# Patient Record
Sex: Female | Born: 1990 | Race: Black or African American | Hispanic: No | Marital: Single | Smoking: Never smoker
Health system: Southern US, Community
[De-identification: ages and names within clinical notes are randomized; demographics above are authoritative.]

## PROBLEM LIST (undated history)

## (undated) ENCOUNTER — Inpatient Hospital Stay (HOSPITAL_COMMUNITY): Payer: Self-pay

## (undated) ENCOUNTER — Emergency Department: Discharge status: Absent without leave | Payer: Self-pay | Source: Home / Self Care

## (undated) ENCOUNTER — Ambulatory Visit (HOSPITAL_COMMUNITY): Admission: EM | Payer: Medicaid Other

## (undated) DIAGNOSIS — J45909 Unspecified asthma, uncomplicated: Secondary | ICD-10-CM

---
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
## 2012-08-15 ENCOUNTER — Emergency Department (HOSPITAL_COMMUNITY)
Admission: EM | Admit: 2012-08-15 | Discharge: 2012-08-15 | Disposition: A | Discharge status: Home Health | Payer: BC Managed Care – PPO | Attending: Emergency Medicine | Admitting: Emergency Medicine

## 2012-08-15 ENCOUNTER — Encounter (HOSPITAL_COMMUNITY): Payer: Self-pay

## 2012-08-15 DIAGNOSIS — J45909 Unspecified asthma, uncomplicated: Secondary | ICD-10-CM | POA: Insufficient documentation

## 2012-08-15 DIAGNOSIS — Z113 Encounter for screening for infections with a predominantly sexual mode of transmission: Secondary | ICD-10-CM | POA: Insufficient documentation

## 2012-08-15 DIAGNOSIS — Z139 Encounter for screening, unspecified: Secondary | ICD-10-CM

## 2012-08-15 HISTORY — DX: Unspecified asthma, uncomplicated: J45.909

## 2012-08-15 MED ORDER — ONDANSETRON 4 MG PO TBDP
8.0000 mg | ORAL_TABLET | Freq: Once | ORAL | Status: AC
  Administered 2012-08-15: 8 mg via ORAL
  Filled 2012-08-15: qty 1

## 2012-08-15 MED ORDER — CEFTRIAXONE SODIUM 250 MG IJ SOLR
250.0000 mg | Freq: Once | INTRAMUSCULAR | Status: AC
  Administered 2012-08-15: 250 mg via INTRAMUSCULAR
  Filled 2012-08-15: qty 250

## 2012-08-15 MED ORDER — LIDOCAINE HCL (PF) 1 % IJ SOLN
INTRAMUSCULAR | Status: AC
  Administered 2012-08-15: 11:00:00
  Filled 2012-08-15: qty 5

## 2012-08-15 MED ORDER — AZITHROMYCIN 250 MG PO TABS
1000.0000 mg | ORAL_TABLET | Freq: Once | ORAL | Status: AC
  Administered 2012-08-15: 1000 mg via ORAL
  Filled 2012-08-15: qty 4

## 2012-08-15 NOTE — ED Notes (Signed)
Pt sexual partner seen here yesterday and dx with Chlamydia.  Pt tested [positive yesterday for same at health department. Was told by health department that they only given treatment on Tuesdays and Thursday so pt is here for med

## 2012-08-15 NOTE — ED Provider Notes (Signed)
History     CSN: [id number]  Arrival date & time 08/15/12  1018   First MD Initiated Contact with Patient 08/15/12 1025      Chief Complaint  Patient presents with  . SEXUALLY TRANSMITTED DISEASE     HPI Pt was seen at 1040.  Per pt, c/o unknown onset and persistence of constant STD exposure that began several days ago.  Pt states her sexual partner was dx with chlamydia yesterday.  States she was eval at [facility] for same, dx with chlamydia, but "they can't treat me until Tuesday so I came here to get meds."  Denies dysuria, no vaginal bleeding/discharge, no abd/pelvic pain, no back pain, no N/V/D, no fevers.     Past Medical History  Diagnosis Date  . Asthma     No past surgical history on file.   History  Substance Use Topics  . Smoking status: Never Smoker   . Smokeless tobacco: Not on file  . Alcohol Use: No      Review of Systems ROS: Statement: All systems negative except as marked or noted in the HPI; Constitutional: Negative for fever and chills. ; ; Eyes: Negative for eye pain, redness and discharge. ; ; ENMT: Negative for ear pain, hoarseness, nasal congestion, sinus pressure and sore throat. ; ; Cardiovascular: Negative for chest pain, palpitations, diaphoresis, dyspnea and peripheral edema. ; ; Respiratory: Negative for cough, wheezing and stridor. ; ; Gastrointestinal: Negative for nausea, vomiting, diarrhea, abdominal pain, blood in stool, hematemesis, jaundice and rectal bleeding. . ; ; Genitourinary: Negative for dysuria, flank pain and hematuria. ; ; Musculoskeletal: Negative for back pain and neck pain. Negative for swelling and trauma.; ; GYN:  No vaginal bleeding, no vaginal discharge, no vulvar pain. ;; Skin: Negative for pruritus, rash, abrasions, blisters, bruising and skin lesion.; ; Neuro: Negative for headache, lightheadedness and neck stiffness. Negative for weakness, altered level of consciousness , altered mental status, extremity weakness, paresthesias,  involuntary movement, seizure and syncope.     Allergies  Review of patient's allergies indicates no known allergies.  Home Medications  No current outpatient prescriptions on file.  BP 110/80  Pulse 75  Temp 98 F (36.7 C) (Oral)  Resp 20  SpO2 100%  LMP 08/01/2012  Physical Exam 1045: Physical examination:  Nursing notes reviewed; Vital signs and O2 SAT reviewed;  Constitutional: Well developed, Well nourished, Well hydrated, In no acute distress; Head:  Normocephalic, atraumatic; Eyes: EOMI, PERRL, No scleral icterus; ENMT: Mouth and pharynx normal, Mucous membranes moist; Neck: Supple, Full range of motion, No lymphadenopathy; Cardiovascular: Regular rate and rhythm, No murmur, rub, or gallop; Respiratory: Breath sounds clear & equal bilaterally, No rales, rhonchi, wheezes.  Speaking full sentences with ease, Normal respiratory effort/excursion; Chest: Nontender, Movement normal; Abdomen: Soft, Nontender, Nondistended, Normal bowel sounds; Genitourinary: No CVA tenderness; Extremities: Pulses normal, No tenderness, No edema, No calf edema or asymmetry.; Neuro: AA&Ox3, Major CN grossly intact.  Speech clear. No gross focal motor or sensory deficits in extremities.; Skin: Color normal, Warm, Dry.   ED Course  Procedures    MDM  MDM Reviewed: nursing note and vitals      1050:  Will tx for GC and chlamydia here in the ED.  Pt given resources to obtain OB/GYN for good continuity of care and routine women's care.  Verb understaniding.       Laray Anger, DO 08/17/12 1245

## 2013-04-29 ENCOUNTER — Inpatient Hospital Stay (HOSPITAL_COMMUNITY)
Admission: AD | Admit: 2013-04-29 | Discharge: 2013-04-30 | Disposition: A | Discharge status: Home / Self Care | Payer: Self-pay | Source: Ambulatory Visit | Attending: Obstetrics & Gynecology | Admitting: Obstetrics & Gynecology

## 2013-04-29 ENCOUNTER — Encounter (HOSPITAL_COMMUNITY): Payer: Self-pay | Admitting: Emergency Medicine

## 2013-04-29 ENCOUNTER — Emergency Department (HOSPITAL_COMMUNITY)
Admission: EM | Admit: 2013-04-29 | Discharge: 2013-04-29 | Discharge status: Against Medical Advice | Payer: Self-pay | Attending: Emergency Medicine | Admitting: Emergency Medicine

## 2013-04-29 ENCOUNTER — Inpatient Hospital Stay (HOSPITAL_COMMUNITY): Payer: Self-pay

## 2013-04-29 ENCOUNTER — Encounter (HOSPITAL_COMMUNITY): Payer: Self-pay | Admitting: Advanced Practice Midwife

## 2013-04-29 DIAGNOSIS — A499 Bacterial infection, unspecified: Secondary | ICD-10-CM | POA: Insufficient documentation

## 2013-04-29 DIAGNOSIS — O9989 Other specified diseases and conditions complicating pregnancy, childbirth and the puerperium: Secondary | ICD-10-CM | POA: Insufficient documentation

## 2013-04-29 DIAGNOSIS — O209 Hemorrhage in early pregnancy, unspecified: Secondary | ICD-10-CM | POA: Insufficient documentation

## 2013-04-29 DIAGNOSIS — J45909 Unspecified asthma, uncomplicated: Secondary | ICD-10-CM | POA: Insufficient documentation

## 2013-04-29 DIAGNOSIS — O2390 Unspecified genitourinary tract infection in pregnancy, unspecified trimester: Secondary | ICD-10-CM | POA: Insufficient documentation

## 2013-04-29 DIAGNOSIS — M545 Low back pain, unspecified: Secondary | ICD-10-CM | POA: Insufficient documentation

## 2013-04-29 DIAGNOSIS — B9689 Other specified bacterial agents as the cause of diseases classified elsewhere: Secondary | ICD-10-CM | POA: Insufficient documentation

## 2013-04-29 DIAGNOSIS — O200 Threatened abortion: Secondary | ICD-10-CM | POA: Insufficient documentation

## 2013-04-29 DIAGNOSIS — R109 Unspecified abdominal pain: Secondary | ICD-10-CM | POA: Insufficient documentation

## 2013-04-29 DIAGNOSIS — N760 Acute vaginitis: Secondary | ICD-10-CM | POA: Insufficient documentation

## 2013-04-29 LAB — URINALYSIS, ROUTINE W REFLEX MICROSCOPIC
Bilirubin Urine: NEGATIVE
Glucose, UA: NEGATIVE mg/dL
Hgb urine dipstick: NEGATIVE
Ketones, ur: NEGATIVE mg/dL
Leukocytes, UA: NEGATIVE
Nitrite: NEGATIVE
Protein, ur: NEGATIVE mg/dL
Specific Gravity, Urine: 1.025 (ref 1.005–1.030)
Urobilinogen, UA: 1 mg/dL (ref 0.0–1.0)
pH: 8 (ref 5.0–8.0)

## 2013-04-29 LAB — COMPREHENSIVE METABOLIC PANEL
ALT: 13 U/L (ref 0–35)
AST: 15 U/L (ref 0–37)
Albumin: 4 g/dL (ref 3.5–5.2)
Alkaline Phosphatase: 45 U/L (ref 39–117)
BUN: 15 mg/dL (ref 6–23)
CO2: 25 mEq/L (ref 19–32)
Calcium: 9.3 mg/dL (ref 8.4–10.5)
Chloride: 100 mEq/L (ref 96–112)
Creatinine, Ser: 0.68 mg/dL (ref 0.50–1.10)
GFR calc Af Amer: >90 mL/min (ref >90)
GFR calc non Af Amer: >90 mL/min (ref >90)
Glucose, Bld: 88 mg/dL (ref 70–99)
Potassium: 3.5 mEq/L (ref 3.5–5.1)
Sodium: 136 mEq/L (ref 135–145)
Total Bilirubin: 1.5 mg/dL — ABNORMAL HIGH (ref 0.3–1.2)
Total Protein: 7 g/dL (ref 6.0–8.3)

## 2013-04-29 LAB — CBC WITH DIFFERENTIAL/PLATELET
Basophils Absolute: 0 10*3/uL (ref 0.0–0.1)
Basophils Relative: 0 % (ref 0–1)
Eosinophils Absolute: 0.3 10*3/uL (ref 0.0–0.7)
Eosinophils Relative: 2 % (ref 0–5)
HCT: 35.9 % — ABNORMAL LOW (ref 36.0–46.0)
Hemoglobin: 12.2 g/dL (ref 12.0–15.0)
Lymphocytes Relative: 24 % (ref 12–46)
Lymphs Abs: 2.9 10*3/uL (ref 0.7–4.0)
MCH: 27.3 pg (ref 26.0–34.0)
MCHC: 34 g/dL (ref 30.0–36.0)
MCV: 80.3 fL (ref 78.0–100.0)
Monocytes Absolute: 0.6 10*3/uL (ref 0.1–1.0)
Monocytes Relative: 5 % (ref 3–12)
Neutro Abs: 8.4 10*3/uL — ABNORMAL HIGH (ref 1.7–7.7)
Neutrophils Relative %: 69 % (ref 43–77)
Platelets: 260 10*3/uL (ref 150–400)
RBC: 4.47 MIL/uL (ref 3.87–5.11)
RDW: 12.4 % (ref 11.5–15.5)
WBC: 12.1 10*3/uL — ABNORMAL HIGH (ref 4.0–10.5)

## 2013-04-29 LAB — POCT PREGNANCY, URINE: Preg Test, Ur: POSITIVE — AB

## 2013-04-29 LAB — HCG, QUANTITATIVE, PREGNANCY: hCG, Beta Chain, Quant, S: 6732 m[IU]/mL — ABNORMAL HIGH (ref <5)

## 2013-04-29 NOTE — ED Notes (Signed)
Pt asked nurse 1st if it would "mess anything up" if she went to [facility] instead.  Encouraged pt to stay and explained to her that the wait was not much longer and there are multiple discharges. Pt told visitor and he is [facility]  Pt states again that she is leaving.  Informed pt that test results were back and that it would not be long before she saw MD.  Pt told visitor again and he told her to go ahead and leave.  Debby Bud, pt advocate also encouraged pt and visitor to stay and they left.

## 2013-04-29 NOTE — ED Notes (Signed)
PT. REPORTS VAGINAL SPOTTING FOR SEVERAL DAYS WITH LOW ABDOMINAL CRAMPING / LOW BACK PAIN FOR 2 WEEKS , PT. STATED POSITIVE HOME PREG TEST , UNSURE OF LMP AND AOG.

## 2013-04-29 NOTE — MAU Note (Signed)
Patient states she was seen at [facility] and left AMA due to the wait. States she has had no bleeding since 1800 today. Denies bright red bleeding. States it was brown "spotting" and a little red after. Here to be seen for same symptoms.

## 2013-04-29 NOTE — MAU Note (Signed)
Pt reports pain in lower abd, sharp pain in lower back, and spotting today.  Positive home preg test , LMP 03/22/2013

## 2013-04-29 NOTE — ED Notes (Signed)
Unable to discharge pt from [facility] due to her being in computer at [facility].  Notified MAU and they are attempted to fix.

## 2013-04-30 ENCOUNTER — Encounter (HOSPITAL_COMMUNITY): Payer: Self-pay

## 2013-04-30 DIAGNOSIS — O200 Threatened abortion: Secondary | ICD-10-CM

## 2013-04-30 LAB — WET PREP, GENITAL
Trich, Wet Prep: NONE SEEN
Yeast Wet Prep HPF POC: NONE SEEN

## 2013-04-30 LAB — ABO/RH: ABO/RH(D): A POS

## 2013-04-30 LAB — GC/CHLAMYDIA PROBE AMP
CT Probe RNA: NEGATIVE
GC Probe RNA: NEGATIVE

## 2013-04-30 MED ORDER — CONCEPT OB 130-92.4-1 MG PO CAPS: 1.0000 | ORAL_CAPSULE | Freq: Every day | ORAL | Status: DC

## 2013-04-30 MED ORDER — METRONIDAZOLE 500 MG PO TABS: 500.0000 mg | ORAL_TABLET | Freq: Two times a day (BID) | ORAL | Status: DC

## 2013-04-30 NOTE — MAU Provider Note (Signed)
Chief Complaint: Abdominal Pain, Vaginal Bleeding and Possible Pregnancy   First Provider Initiated Contact with Patient 04/29/13 2315     SUBJECTIVE HPI: Taylor Strickland is a 22 y.o. G1P0 at [redacted]w[redacted]d by LMP who presents with pos UPT and spotting cramping x a few days. Denies fever, chills, urinary complaints, GI complaints, vaginal discharge, passage of clots or tissue. Was seen at [facility] ED prior to MAU visit but left AMA due to way time. Labs were drawn the pelvic exam ultrasound has not been done.  Past Medical History  Diagnosis Date  . Asthma    OB History   Grav Para Term Preterm Abortions TAB SAB Ect Mult Living   1              # Outc Date GA Lbr Len/2nd Wgt Sex Del Anes PTL Lv   1 CUR              History reviewed. No pertinent past surgical history. History   Social History  . Marital Status: Single    Spouse Name: N/A    Number of Children: N/A  . Years of Education: N/A   Occupational History  . Not on file.   Social History Main Topics  . Smoking status: Never Smoker   . Smokeless tobacco: Not on file  . Alcohol Use: No  . Drug Use: No  . Sexually Active: Yes   Other Topics Concern  . Not on file   Social History Narrative  . No narrative on file   No current facility-administered medications on file prior to encounter.   No current outpatient prescriptions on file prior to encounter.   No Known Allergies  ROS: Pertinent items in HPI  OBJECTIVE Blood pressure 113/76, pulse 75, temperature 98.6 F (37 C), temperature source Oral, resp. rate 18, height 5\' 2"  (1.575 m), weight 63.05 kg (139 lb), last menstrual period 03/22/2013, SpO2 100.00%. GENERAL: Well-developed, well-nourished female in no acute distress.  HEENT: Normocephalic HEART: normal rate RESP: normal effort ABDOMEN: Soft, non-tender EXTREMITIES: Nontender, no edema NEURO: Alert and oriented SPECULUM EXAM: NEFG, physiologic discharge, no blood noted, cervix clean, anterior  cervix BIMANUAL: cervix closed; uterus slightly enlarged, retroverted, no adnexal tenderness or masses. No CMT.  LAB RESULTS Results for orders placed during the hospital encounter of 04/29/13 (from the past 24 hour(s))  WET PREP, GENITAL     Status: Abnormal   Collection Time    04/29/13 11:55 PM      Result Value Range   Yeast Wet Prep HPF POC NONE SEEN  NONE SEEN   Trich, Wet Prep NONE SEEN  NONE SEEN   Clue Cells Wet Prep HPF POC FEW (*) NONE SEEN   WBC, Wet Prep HPF POC FEW (*) NONE SEEN  ABO/RH     Status: None   Collection Time    04/29/13 11:55 PM      Result Value Range   ABO/RH(D) A POS     Results for Taylor Strickland (MRN [id number]) as of 04/30/2013 00:23  Ref. Range 04/29/2013 21:40 04/29/2013 21:41 04/29/2013 21:47  Sodium Latest Range: 135-145 mEq/L  136   Potassium Latest Range: 3.5-5.1 mEq/L  3.5   Chloride Latest Range: 96-112 mEq/L  100   CO2 Latest Range: 19-32 mEq/L  25   BUN Latest Range: 6-23 mg/dL  15   Creatinine Latest Range: 0.50-1.10 mg/dL  4.09   Calcium Latest Range: 8.4-10.5 mg/dL  9.3   GFR calc non Af Amer Latest Range: >  90 mL/min  >90   GFR calc Af Amer Latest Range: >90 mL/min  >90   Glucose Latest Range: 70-99 mg/dL  88   Alkaline Phosphatase Latest Range: 39-117 U/L  45   Albumin Latest Range: 3.5-5.2 g/dL  4.0   AST Latest Range: 0-37 U/L  15   ALT Latest Range: 0-35 U/L  13   Total Protein Latest Range: 6.0-8.3 g/dL  7.0   Total Bilirubin Latest Range: 0.3-1.2 mg/dL  1.5 (H)   hCG, Beta Chain, Quant, S Latest Range: <5 mIU/mL 6732 (H)    WBC Latest Range: 4.0-10.5 K/uL  12.1 (H)   RBC Latest Range: 3.87-5.11 MIL/uL  4.47   Hemoglobin Latest Range: 12.0-15.0 g/dL  78.2   HCT Latest Range: 36.0-46.0 %  35.9 (L)   MCV Latest Range: 78.0-100.0 fL  80.3   MCH Latest Range: 26.0-34.0 pg  27.3   MCHC Latest Range: 30.0-36.0 g/dL  95.6   RDW Latest Range: 11.5-15.5 %  12.4   Platelets Latest Range: 150-400 K/uL  260   Neutrophils Relative %  Latest Range: 43-77 %  69   Lymphocytes Relative Latest Range: 12-46 %  24   Monocytes Relative Latest Range: 3-12 %  5   Eosinophils Relative Latest Range: 0-5 %  2   Basophils Relative Latest Range: 0-1 %  0   NEUT# Latest Range: 1.7-7.7 K/uL  8.4 (H)   Lymphocytes Absolute Latest Range: 0.7-4.0 K/uL  2.9   Monocytes Absolute Latest Range: 0.1-1.0 K/uL  0.6   Eosinophils Absolute Latest Range: 0.0-0.7 K/uL  0.3   Basophils Absolute Latest Range: 0.0-0.1 K/uL  0.0   Preg Test, Ur Latest Range: NEGATIVE    POSITIVE (A)    IMAGING US Ob Comp Less 14 Wks  04/30/2013   *RADIOLOGY REPORT*  Clinical Data: Pelvic cramping and vaginal spotting.  OBSTETRIC <14 WK [place] AND TRANSVAGINAL OB US  Technique:  Both transabdominal and transvaginal ultrasound examinations were performed for complete evaluation of the gestation as well as the maternal uterus, adnexal regions, and pelvic cul-de-sac.  Transvaginal technique was performed to assess early pregnancy.  Comparison:  None.  Intrauterine gestational sac:  Visualized/normal in shape. Yolk sac: Yes Embryo: No Cardiac Activity: N/A  MSD: 9.4 mm  5 w 4 d  Maternal uterus/adnexae: No subchorionic hemorrhage is noted.  The uterus is retroverted and otherwise unremarkable in appearance.  The ovaries are within normal limits.  The right ovary measures 3.9 x 2.2 x 2.8 cm, while the left ovary measures 3.2 x 1.7 x 2.0 cm. No suspicious adnexal masses are seen; there is no evidence for ovarian torsion.  Trace free fluid is noted in the pelvic cul-de-sac, likely physiologic in nature.  IMPRESSION: Single intrauterine gestational sac noted, with a mean sac diameter of 0.9 cm, corresponding to a gestational age of [redacted] weeks 4 days. This matches the gestational age of [redacted] weeks 3 days by LMP, reflecting an estimated date of delivery of December 27, 2013.  The embryo is not yet seen.   Original Report Authenticated By: Tonia Ghent, M.D.   US Ob Transvaginal  04/30/2013    *RADIOLOGY REPORT*  Clinical Data: Pelvic cramping and vaginal spotting.  OBSTETRIC <14 WK [place] AND TRANSVAGINAL OB US  Technique:  Both transabdominal and transvaginal ultrasound examinations were performed for complete evaluation of the gestation as well as the maternal uterus, adnexal regions, and pelvic cul-de-sac.  Transvaginal technique was performed to assess early pregnancy.  Comparison:  None.  Intrauterine gestational sac:  Visualized/normal in shape. Yolk sac: Yes Embryo: No Cardiac Activity: N/A  MSD: 9.4 mm  5 w 4 d  Maternal uterus/adnexae: No subchorionic hemorrhage is noted.  The uterus is retroverted and otherwise unremarkable in appearance.  The ovaries are within normal limits.  The right ovary measures 3.9 x 2.2 x 2.8 cm, while the left ovary measures 3.2 x 1.7 x 2.0 cm. No suspicious adnexal masses are seen; there is no evidence for ovarian torsion.  Trace free fluid is noted in the pelvic cul-de-sac, likely physiologic in nature.  IMPRESSION: Single intrauterine gestational sac noted, with a mean sac diameter of 0.9 cm, corresponding to a gestational age of [redacted] weeks 4 days. This matches the gestational age of [redacted] weeks 3 days by LMP, reflecting an estimated date of delivery of December 27, 2013.  The embryo is not yet seen.   Original Report Authenticated By: Tonia Ghent, M.D.    MAU COURSE No bleeding while in MAU.  ASSESSMENT 1. Threatened miscarriage in early pregnancy. IUP verified.   2. BV (bacterial vaginosis)    PLAN Discharge home in stable condition. Pelvic rest x1 week. GC Chlamydia pending. SAB precautions. Follow-up Information   Follow up with Obstetrician. (Start prenatal care)       Follow up with [facility] [place] [facility]. (As needed if symptoms worsen)    Contact information:   [address] [id number] [place] [address] [phone]       Medication List    TAKE these medications       CONCEPT OB 130-92.4-1  MG Caps  Take 1 tablet by mouth daily.     metroNIDAZOLE 500 MG tablet  Commonly known as:  FLAGYL  Take 1 tablet (500 mg total) by mouth 2 (two) times daily.       [place], [place] 04/30/2013  12:33 AM

## 2013-06-01 ENCOUNTER — Encounter (HOSPITAL_COMMUNITY): Payer: Self-pay | Admitting: Emergency Medicine

## 2013-06-01 ENCOUNTER — Emergency Department (HOSPITAL_COMMUNITY)
Admission: EM | Admit: 2013-06-01 | Discharge: 2013-06-01 | Disposition: A | Discharge status: Home / Self Care | Payer: Medicaid Other | Attending: Emergency Medicine | Admitting: Emergency Medicine

## 2013-06-01 DIAGNOSIS — Z3201 Encounter for pregnancy test, result positive: Secondary | ICD-10-CM | POA: Insufficient documentation

## 2013-06-01 DIAGNOSIS — R079 Chest pain, unspecified: Secondary | ICD-10-CM

## 2013-06-01 DIAGNOSIS — Z3490 Encounter for supervision of normal pregnancy, unspecified, unspecified trimester: Secondary | ICD-10-CM

## 2013-06-01 DIAGNOSIS — R109 Unspecified abdominal pain: Secondary | ICD-10-CM

## 2013-06-01 DIAGNOSIS — O9989 Other specified diseases and conditions complicating pregnancy, childbirth and the puerperium: Secondary | ICD-10-CM | POA: Insufficient documentation

## 2013-06-01 DIAGNOSIS — J45909 Unspecified asthma, uncomplicated: Secondary | ICD-10-CM | POA: Insufficient documentation

## 2013-06-01 LAB — BASIC METABOLIC PANEL
BUN: 12 mg/dL (ref 6–23)
CO2: 24 mEq/L (ref 19–32)
Calcium: 9.4 mg/dL (ref 8.4–10.5)
Chloride: 100 mEq/L (ref 96–112)
Creatinine, Ser: 0.51 mg/dL (ref 0.50–1.10)
GFR calc Af Amer: >90 mL/min (ref >90)
GFR calc non Af Amer: >90 mL/min (ref >90)
Glucose, Bld: 94 mg/dL (ref 70–99)
Potassium: 3.8 mEq/L (ref 3.5–5.1)
Sodium: 134 mEq/L — ABNORMAL LOW (ref 135–145)

## 2013-06-01 LAB — CBC
HCT: 36.9 % (ref 36.0–46.0)
Hemoglobin: 12.6 g/dL (ref 12.0–15.0)
MCH: 27.5 pg (ref 26.0–34.0)
MCHC: 34.1 g/dL (ref 30.0–36.0)
MCV: 80.4 fL (ref 78.0–100.0)
Platelets: 221 10*3/uL (ref 150–400)
RBC: 4.59 MIL/uL (ref 3.87–5.11)
RDW: 12.9 % (ref 11.5–15.5)
WBC: 12.7 10*3/uL — ABNORMAL HIGH (ref 4.0–10.5)

## 2013-06-01 LAB — URINALYSIS W MICROSCOPIC + REFLEX CULTURE
Bilirubin Urine: NEGATIVE
Glucose, UA: NEGATIVE mg/dL
Hgb urine dipstick: NEGATIVE
Ketones, ur: NEGATIVE mg/dL
Leukocytes, UA: NEGATIVE
Nitrite: NEGATIVE
Protein, ur: NEGATIVE mg/dL
Specific Gravity, Urine: 1.022 (ref 1.005–1.030)
Urobilinogen, UA: 0.2 mg/dL (ref 0.0–1.0)
pH: 7 (ref 5.0–8.0)

## 2013-06-01 LAB — POCT I-STAT TROPONIN I: Troponin i, poc: 0 ng/mL (ref 0.00–0.08)

## 2013-06-01 LAB — PREGNANCY, URINE: Preg Test, Ur: POSITIVE — AB

## 2013-06-01 LAB — WET PREP, GENITAL
Trich, Wet Prep: NONE SEEN
WBC, Wet Prep HPF POC: NONE SEEN
Yeast Wet Prep HPF POC: NONE SEEN

## 2013-06-01 NOTE — ED Notes (Signed)
Pt states she has been having intermittent chest pain and back pain for past month.  Pt denies cp at present.  Pt is [redacted] weeks pregnant.  Pt states she gets sob during pain episodes.

## 2013-06-01 NOTE — ED Provider Notes (Signed)
History    CSN: [id number] Arrival date & time 06/01/13  1724  First MD Initiated Contact with Patient 06/01/13 1921     Chief Complaint  Patient presents with  . abdominal/chest pain, [redacted] weeks pregnant   . Chest Pain   (Consider location/radiation/quality/duration/timing/severity/associated sxs/prior Treatment) HPI Taylor Strickland is a 22 y.o. female who presents to ED with complaint of intermittent chest pain, abdominal pain for a month. States pains in stomach and abdomen are independent of each other. Come and go lasting from several minute to several hours. Chest pains are sharp. Abdominal pains are crampy. Pt denies fever, chills, cough, shortness of breath, nausea, vomiting, urinary symptoms, vaginal bleeding or discharge. Pt has not taken any  Medications for this. Pt states she is about [redacted]wks pregnant by [place] which was done 4 wks ago.    Past Medical History  Diagnosis Date  . Asthma    History reviewed. No pertinent past surgical history. History reviewed. No pertinent family history. History  Substance Use Topics  . Smoking status: Never Smoker   . Smokeless tobacco: Not on file  . Alcohol Use: No   OB History   Grav Para Term Preterm Abortions TAB SAB Ect Mult Living   1              Review of Systems  Constitutional: Negative for chills and fatigue.  HENT: Negative for neck pain and neck stiffness.   Respiratory: Positive for chest tightness. Negative for shortness of breath.   Cardiovascular: Positive for chest pain. Negative for leg swelling.  Gastrointestinal: Positive for abdominal pain. Negative for nausea, vomiting, diarrhea and constipation.  Genitourinary: Positive for pelvic pain. Negative for dysuria, flank pain, vaginal bleeding and vaginal discharge.  Neurological: Negative for weakness and headaches.    Allergies  Review of patient's allergies indicates no known allergies.  Home Medications   Current Outpatient Rx  Name  Route  Sig  Dispense   Refill  . Prenat w/o A Vit-FeFum-FePo-FA (CONCEPT OB) 130-92.4-1 MG CAPS   Oral   Take 1 tablet by mouth daily.   30 capsule   12    BP 102/63  Pulse 82  Temp(Src) 98.1 F (36.7 C) (Oral)  Resp 16  SpO2 100%  LMP 03/22/2013 Physical Exam  Nursing note and vitals reviewed. Constitutional: She is oriented to person, place, and time. She appears well-developed and well-nourished.  HENT:  Head: Normocephalic.  Eyes: Conjunctivae are normal.  Neck: Neck supple.  Cardiovascular: Normal rate, regular rhythm and normal heart sounds.   Pulmonary/Chest: Effort normal and breath sounds normal. No respiratory distress. She has no wheezes. She has no rales. She exhibits tenderness.  Abdominal: Soft. Bowel sounds are normal. She exhibits no distension. There is no tenderness. There is no rebound and no guarding.  Genitourinary:  Normal external genitalia. Normal vaginal canal. Cervix normal. No CMT. No adnexal tenderenss  Neurological: She is alert and oriented to person, place, and time.  Skin: Skin is warm and dry.    ED Course  Procedures (including critical care time) Results for orders placed during the hospital encounter of 06/01/13  WET PREP, GENITAL      Result Value Range   Yeast Wet Prep HPF POC NONE SEEN  NONE SEEN   Trich, Wet Prep NONE SEEN  NONE SEEN   Clue Cells Wet Prep HPF POC FEW (*) NONE SEEN   WBC, Wet Prep HPF POC NONE SEEN  NONE SEEN  CBC  Result Value Range   WBC 12.7 (*) 4.0 - 10.5 K/uL   RBC 4.59  3.87 - 5.11 MIL/uL   Hemoglobin 12.6  12.0 - 15.0 g/dL   HCT 96.2  95.2 - 84.1 %   MCV 80.4  78.0 - 100.0 fL   MCH 27.5  26.0 - 34.0 pg   MCHC 34.1  30.0 - 36.0 g/dL   RDW 32.4  40.1 - 02.7 %   Platelets 221  150 - 400 K/uL  BASIC METABOLIC PANEL      Result Value Range   Sodium 134 (*) 135 - 145 mEq/L   Potassium 3.8  3.5 - 5.1 mEq/L   Chloride 100  96 - 112 mEq/L   CO2 24  19 - 32 mEq/L   Glucose, Bld 94  70 - 99 mg/dL   BUN 12  6 - 23 mg/dL    Creatinine, Ser 2.53  0.50 - 1.10 mg/dL   Calcium 9.4  8.4 - 66.4 mg/dL   GFR calc non Af Amer >90  >90 mL/min   GFR calc Af Amer >90  >90 mL/min  URINALYSIS W MICROSCOPIC + REFLEX CULTURE      Result Value Range   Color, Urine YELLOW  YELLOW   APPearance CLEAR  CLEAR   Specific Gravity, Urine 1.022  1.005 - 1.030   pH 7.0  5.0 - 8.0   Glucose, UA NEGATIVE  NEGATIVE mg/dL   Hgb urine dipstick NEGATIVE  NEGATIVE   Bilirubin Urine NEGATIVE  NEGATIVE   Ketones, ur NEGATIVE  NEGATIVE mg/dL   Protein, ur NEGATIVE  NEGATIVE mg/dL   Urobilinogen, UA 0.2  0.0 - 1.0 mg/dL   Nitrite NEGATIVE  NEGATIVE   Leukocytes, UA NEGATIVE  NEGATIVE  PREGNANCY, URINE      Result Value Range   Preg Test, Ur POSITIVE (*) NEGATIVE  POCT I-STAT TROPONIN I      Result Value Range   Troponin i, poc 0.00  0.00 - 0.08 ng/mL   Comment 3             Date: 06/01/2013  Rate: 80  Rhythm: normal sinus rhythm  QRS Axis: normal  Intervals: normal  ST/T Wave abnormalities: normal  Conduction Disutrbances:none  Narrative Interpretation:   Old EKG Reviewed: none available   No results found.   No results found. 1. Pregnancy   2. Chest pain   3. Abdominal  pain, other specified site     MDM  Pt with intermittent chest pains, intermittent abdominal pains. No current symptoms. Exam unremarkable. Pt's VS are normal. Doubt PE given no SOB, no tachycardia, no tachypnea, and she has no present pain, doubt PE. Her pelvic exam unremarkable. She has already had an [place] this pregnancy with a confirmed IUP. She is having no vagina bleeding. At this time no emergent conditions identified and pt is stable for discharge with outpatient follow  Filed Vitals:   06/01/13 1737 06/01/13 2129  BP: 102/63 108/73  Pulse: 82   Temp: 98.1 F (36.7 C)   TempSrc: Oral   Resp: 16 14  SpO2: 100%      Roni Friberg A Shiah Berhow, PA-C 06/01/13 2345

## 2013-06-02 LAB — GC/CHLAMYDIA PROBE AMP
CT Probe RNA: NEGATIVE
GC Probe RNA: NEGATIVE

## 2013-06-02 NOTE — ED Provider Notes (Signed)
Medical screening examination/treatment/procedure(s) were performed by non-physician practitioner and as supervising physician I was immediately available for consultation/collaboration.  Jacia Sickman, MD 06/02/13 0120 

## 2013-12-03 HISTORY — PX: WISDOM TOOTH EXTRACTION: SHX21

## 2013-12-14 ENCOUNTER — Encounter (HOSPITAL_COMMUNITY): Payer: Self-pay | Admitting: Emergency Medicine

## 2013-12-14 ENCOUNTER — Emergency Department (HOSPITAL_COMMUNITY)
Admission: EM | Admit: 2013-12-14 | Discharge: 2013-12-14 | Disposition: A | Discharge status: Home / Self Care | Payer: Medicaid Other | Source: Home / Self Care | Attending: Emergency Medicine | Admitting: Emergency Medicine

## 2013-12-14 DIAGNOSIS — J019 Acute sinusitis, unspecified: Secondary | ICD-10-CM

## 2013-12-14 LAB — POCT RAPID STREP A: Streptococcus, Group A Screen (Direct): NEGATIVE

## 2013-12-14 MED ORDER — AMOXICILLIN-POT CLAVULANATE 875-125 MG PO TABS: 1.0000 | ORAL_TABLET | Freq: Two times a day (BID) | ORAL | Status: DC

## 2013-12-14 MED ORDER — FLUTICASONE PROPIONATE 50 MCG/ACT NA SUSP
2.0000 | Freq: Every day | NASAL | Status: DC
Start: 2013-12-14 — End: 2015-11-27

## 2013-12-14 MED ORDER — TRAMADOL HCL 50 MG PO TABS: ORAL_TABLET | ORAL | Status: DC

## 2013-12-14 NOTE — ED Notes (Signed)
Pt  Reports  Symptoms  Of  sorethroat  X  10 days   With a  Non  Productive  Cough  With  Side  Pain  X  10   Days              She also  Reports  Some  Mild  Malaise  As  Well

## 2013-12-14 NOTE — ED Provider Notes (Signed)
Chief Complaint   Chief Complaint  Patient presents with  . Sore Throat    History of Present Illness   Jerene Cannyshley Leisey is a 23 year old female who's had a two-week history of nasal congestion with yellowish, bloody drainage, sinus pressure, headache, watery eyes, dry cough, wheezing, and chest tightness. The sides of the rib cage ache when she coughs. She's had a sore throat, felt weak, tired, dizzy, chills, and nausea. She denies any specific sick exposures.  Review of Systems   Other than as noted above, the patient denies any of the following symptoms: Systemic:  No fevers, chills, sweats, or myalgias. Eye:  No redness or discharge. ENT:  No ear pain, headache, nasal congestion, drainage, sinus pressure, or sore throat. Neck:  No neck pain, stiffness, or swollen glands. Lungs:  No cough, sputum production, hemoptysis, wheezing, chest tightness, shortness of breath or chest pain. GI:  No abdominal pain, nausea, vomiting or diarrhea.  PMFSH   Past medical history, family history, social history, meds, and allergies were reviewed. She has a history of asthma in the remote past but none recently.  Physical exam   Vital signs:  BP 111/78  Pulse 74  Temp(Src) 98.5 F (36.9 C) (Oral)  Resp 14  SpO2 100%  LMP 12/03/2013  Breastfeeding? Unknown General:  Alert and oriented.  In no distress.  Skin warm and dry. Eye:  No conjunctival injection or drainage. Lids were normal. ENT:  TMs and canals were normal, without erythema or inflammation.  Nasal mucosa was clear and uncongested, copious yellow drainage.  Mucous membranes were moist.  Pharynx was erythematous with no exudate or drainage.  There were no oral ulcerations or lesions. Neck:  Supple, no adenopathy, tenderness or mass. Lungs:  No respiratory distress.  Lungs were clear to auscultation, without wheezes, rales or rhonchi.  Breath sounds were clear and equal bilaterally.  Heart:  Regular rhythm, without gallops, murmers or  rubs. Skin:  Clear, warm, and dry, without rash or lesions.  Labs   Results for orders placed during the hospital encounter of 12/14/13  POCT RAPID STREP A ([facility] URG CARE ONLY)      Result Value Range   Streptococcus, Group A Screen (Direct) NEGATIVE  NEGATIVE    Assessment     The encounter diagnosis was Acute sinusitis.  Plan    1.  Meds:  The following meds were prescribed:   Discharge Medication List as of 12/14/2013  4:50 PM    START taking these medications   Details  amoxicillin-clavulanate (AUGMENTIN) 875-125 MG per tablet Take 1 tablet by mouth 2 (two) times daily., Starting 12/14/2013, Until Discontinued, Normal    fluticasone (FLONASE) 50 MCG/ACT nasal spray Place 2 sprays into both nostrils daily., Starting 12/14/2013, Until Discontinued, Normal    traMADol (ULTRAM) 50 MG tablet 1 to 2 every 8 hours as needed for cough., Normal        2.  Patient Education/Counseling:  The patient was given appropriate handouts, self care instructions, and instructed in symptomatic relief.  Instructed to get extra fluids, rest, and use a cool mist vaporizer.   3.  Follow up:  The patient was told to follow up here if no better in 3 to 4 days, or sooner if becoming worse in any way, and given some red flag symptoms such as increasing fever, difficulty breathing, chest pain, or persistent vomiting which would prompt immediate return.  Follow up here as needed.      Reuben Likesavid C Tremon Sainvil, MD  12/14/13 2226 

## 2013-12-14 NOTE — Discharge Instructions (Signed)

## 2014-02-21 ENCOUNTER — Emergency Department (HOSPITAL_COMMUNITY)
Admission: EM | Admit: 2014-02-21 | Discharge: 2014-02-21 | Disposition: A | Discharge status: Home / Self Care | Payer: Medicaid Other | Source: Home / Self Care | Attending: Emergency Medicine | Admitting: Emergency Medicine

## 2014-02-21 ENCOUNTER — Encounter (HOSPITAL_COMMUNITY): Payer: Self-pay | Admitting: Emergency Medicine

## 2014-02-21 DIAGNOSIS — S058X9A Other injuries of unspecified eye and orbit, initial encounter: Secondary | ICD-10-CM

## 2014-02-21 DIAGNOSIS — S0590XA Unspecified injury of unspecified eye and orbit, initial encounter: Secondary | ICD-10-CM

## 2014-02-21 MED ORDER — PREDNISOLONE ACETATE 1 % OP SUSP: 1.0000 [drp] | Freq: Four times a day (QID) | OPHTHALMIC | Status: DC

## 2014-02-21 MED ORDER — TETRACAINE HCL 0.5 % OP SOLN
OPHTHALMIC | Status: AC
  Filled 2014-02-21: qty 2

## 2014-02-21 NOTE — ED Notes (Signed)
CMA forgot to take pt. out after d/c. Dr. Lorenz CoasterKeller [facility] He said pt. left about 2030.

## 2014-02-21 NOTE — ED Notes (Signed)
Patients left eye is red and sensitive to light, sx started Thursday 02/18/2014. Denies any pain or drainage.

## 2014-02-21 NOTE — Discharge Instructions (Signed)
Apply warm compresses to eye, avoid bright lights, wear sunglasses, do not rub eye.  Follow up with Dr. Gwen PoundsKowalski in morning.
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
## 2014-02-22 MED ORDER — TETANUS-DIPHTH-ACELL PERTUSSIS 5-2.5-18.5 LF-MCG/0.5 IM SUSP
INTRAMUSCULAR | Status: AC
  Filled 2014-02-22: qty 0.5

## 2014-10-04 ENCOUNTER — Encounter (HOSPITAL_COMMUNITY): Payer: Self-pay | Admitting: Emergency Medicine

## 2014-12-03 NOTE — L&D Delivery Note (Signed)
Delivery Note At 11:14 PM, on November 25, 2015, a viable female "Taylor Strickland" was delivered via Vaginal, Spontaneous Delivery (Presentation: Right Occiput Anterior with restitution to ROT).  After delivery of head, nuchal cord noted that shoulders and body was delivered through via somersault maneuver. Infant with flaccid tone and minimal grimace. Tactile stimulation and bulb suction given by provider and infant placed on mother's abdomen where nurse continued tactile stimulation. Infant APGAR: 6, 9. Cord clamped, cut, and blood collected. Placenta delivered spontaneously and noted to be intact with 3VC upon inspection. Patient expresses desire to take placenta home.  Vaginal inspection revealed a vaginal laceration that extended into the left labial.  Lacerations were repaired with 3-0 vicryl on CT-1 and SH needle, respectively. Patient given ~7320mL of 1% lidocaine locally.  Patient tolerated the procedure well. Fundus firm, at 2FB below the umbilicus, but was boggy and bleeding moderate.  Patient given 5mL of IV nubain and uterine exploration yielded ~  75mL of clots.  Bleeding small and patient given 1000 mcg of rectal cytotec.  Mother hemodynamically stable and infant skin to skin, with father, prior to provider exit.  Mother unsure of birth control method and opts to breastfeed.  Infant weight at one hour of life: 7lbs 9oz, 20in.   Anesthesia: None  Episiotomy: None Lacerations: Vaginal;Labial Suture Repair: 3.0 vicryl Est. Blood Loss (mL): 275    Mom to postpartum.  Baby to Couplet care / Skin to Skin.  Cherre RobinsJessica L Aydeen Blume MSN, CNM 11/26/2015, 12:42 AM

## 2015-03-30 ENCOUNTER — Inpatient Hospital Stay (HOSPITAL_COMMUNITY)
Admission: AD | Admit: 2015-03-30 | Discharge: 2015-03-30 | Disposition: A | Discharge status: Home / Self Care | Payer: Medicaid Other | Source: Ambulatory Visit | Attending: Obstetrics and Gynecology | Admitting: Obstetrics and Gynecology

## 2015-03-30 ENCOUNTER — Encounter (HOSPITAL_COMMUNITY): Payer: Self-pay | Admitting: *Deleted

## 2015-03-30 ENCOUNTER — Inpatient Hospital Stay (HOSPITAL_COMMUNITY): Payer: Medicaid Other

## 2015-03-30 DIAGNOSIS — R109 Unspecified abdominal pain: Secondary | ICD-10-CM | POA: Diagnosis not present

## 2015-03-30 DIAGNOSIS — O26899 Other specified pregnancy related conditions, unspecified trimester: Secondary | ICD-10-CM

## 2015-03-30 DIAGNOSIS — O9989 Other specified diseases and conditions complicating pregnancy, childbirth and the puerperium: Secondary | ICD-10-CM | POA: Insufficient documentation

## 2015-03-30 DIAGNOSIS — Z3A01 Less than 8 weeks gestation of pregnancy: Secondary | ICD-10-CM | POA: Diagnosis not present

## 2015-03-30 LAB — CBC
HCT: 38.2 % (ref 36.0–46.0)
HEMOGLOBIN: 12.7 g/dL (ref 12.0–15.0)
MCH: 26.5 pg (ref 26.0–34.0)
MCHC: 33.2 g/dL (ref 30.0–36.0)
MCV: 79.7 fL (ref 78.0–100.0)
Platelets: 269 10*3/uL (ref 150–400)
RBC: 4.79 MIL/uL (ref 3.87–5.11)
RDW: 13.9 % (ref 11.5–15.5)
WBC: 12.7 10*3/uL — ABNORMAL HIGH (ref 4.0–10.5)

## 2015-03-30 LAB — WET PREP, GENITAL
Clue Cells Wet Prep HPF POC: NONE SEEN
Trich, Wet Prep: NONE SEEN
WBC, Wet Prep HPF POC: NONE SEEN
YEAST WET PREP: NONE SEEN

## 2015-03-30 LAB — URINALYSIS, ROUTINE W REFLEX MICROSCOPIC
BILIRUBIN URINE: NEGATIVE
Glucose, UA: NEGATIVE mg/dL
Hgb urine dipstick: NEGATIVE
Ketones, ur: NEGATIVE mg/dL
LEUKOCYTES UA: NEGATIVE
Nitrite: NEGATIVE
PH: 6.5 (ref 5.0–8.0)
Protein, ur: NEGATIVE mg/dL
SPECIFIC GRAVITY, URINE: 1.025 (ref 1.005–1.030)
Urobilinogen, UA: 0.2 mg/dL (ref 0.0–1.0)

## 2015-03-30 LAB — HCG, QUANTITATIVE, PREGNANCY: HCG, BETA CHAIN, QUANT, S: 6202 m[IU]/mL — AB (ref <5)

## 2015-03-30 LAB — POCT PREGNANCY, URINE: PREG TEST UR: POSITIVE — AB

## 2015-03-30 LAB — OB RESULTS CONSOLE GC/CHLAMYDIA: Gonorrhea: NEGATIVE

## 2015-03-30 MED ORDER — PRENATAL VITAMINS 28-0.8 MG PO TABS: 1.0000 | ORAL_TABLET | Freq: Every day | ORAL | Status: DC

## 2015-03-30 NOTE — Discharge Instructions (Signed)

## 2015-03-30 NOTE — MAU Note (Addendum)
Gets really sharp cramps in lower stomach. No bleeding or discharge.  Denies GU, some nausea.  +HPT 2 days ago.

## 2015-03-30 NOTE — MAU Provider Note (Signed)
History     CSN: [id number]  Arrival date and time: 03/30/15 1321   None     Chief Complaint  Patient presents with  . Abdominal Pain   HPI    [name] is a 24 y.o. female G2P0010 @ [redacted]w[redacted]d who presents with abdominal pain. The pain started 2 weeks ago Currently rates her pain 5/10; the pain is located in both sides of her lower stomach. The pain is described as sharp cramps; the pain comes and goes. She has taken ibuprofen and the last dose was yesterday; some relief.   She denies vaginal bleeding at this time.   OB History    Gravida Para Term Preterm AB TAB SAB Ectopic Multiple Living   Past Medical History  Diagnosis Date  . Asthma     History reviewed. No pertinent past surgical history.  History reviewed. No pertinent family history.  History  Substance Use Topics  . Smoking status: Never Smoker   . Smokeless tobacco: Not on file  . Alcohol Use: No    Allergies: No Known Allergies  Prescriptions prior to admission  Medication Sig Dispense Refill Last Dose  . fexofenadine (ALLEGRA) 60 MG tablet Take 60 mg by mouth as needed for allergies or rhinitis.   Past Month at Unknown time  . ibuprofen (ADVIL,MOTRIN) 200 MG tablet Take 200 mg by mouth every 6 (six) hours as needed for mild pain.   03/29/2015 at Unknown time  . loratadine (CLARITIN) 10 MG tablet Take 10 mg by mouth as needed for allergies.   Past Month at Unknown time  . amoxicillin-clavulanate (AUGMENTIN) 875-125 MG per tablet Take 1 tablet by mouth 2 (two) times daily. (Patient not taking: Reported on 03/30/2015) 20 tablet 0   . fluticasone (FLONASE) 50 MCG/ACT nasal spray Place 2 sprays into both nostrils daily. (Patient not taking: Reported on 03/30/2015) 16 g 0   . prednisoLONE acetate (PRED FORTE) 1 % ophthalmic suspension Place 1 drop into the left eye 4 (four) times daily. (Patient not taking: Reported on 03/30/2015) 5 mL 0   . Prenat w/o A Vit-FeFum-FePo-FA (CONCEPT OB)  130-92.4-1 MG CAPS Take 1 tablet by mouth daily. (Patient not taking: Reported on 03/30/2015) 30 capsule 12 Past Week at Unknown  . traMADol (ULTRAM) 50 MG tablet 1 to 2 every 8 hours as needed for cough. (Patient not taking: Reported on 03/30/2015) 30 tablet 0    Results for orders placed or performed during the hospital encounter of 03/30/15 (from the past 48 hour(s))  Urinalysis, Routine w reflex microscopic     Status: None   Collection Time: 03/30/15  1:34 PM  Result Value Ref Range   Color, Urine YELLOW YELLOW   APPearance CLEAR CLEAR   Specific Gravity, Urine 1.025 1.005 - 1.030   pH 6.5 5.0 - 8.0   Glucose, UA NEGATIVE NEGATIVE mg/dL   Hgb urine dipstick NEGATIVE NEGATIVE   Bilirubin Urine NEGATIVE NEGATIVE   Ketones, ur NEGATIVE NEGATIVE mg/dL   Protein, ur NEGATIVE NEGATIVE mg/dL   Urobilinogen, UA 0.2 0.0 - 1.0 mg/dL   Nitrite NEGATIVE NEGATIVE   Leukocytes, UA NEGATIVE NEGATIVE    Comment: MICROSCOPIC NOT DONE ON URINES WITH NEGATIVE PROTEIN, BLOOD, LEUKOCYTES, NITRITE, OR GLUCOSE <1000 mg/dL.  Pregnancy, urine POC     Status: Abnormal   Collection Time: 03/30/15  1:45 PM  Result Value Ref Range   Preg Test, Ur  POSITIVE (A) NEGATIVE    Comment:        THE SENSITIVITY OF THIS METHODOLOGY IS >24 mIU/mL   Wet prep, genital     Status: None   Collection Time: 03/30/15  2:20 PM  Result Value Ref Range   Yeast Wet Prep HPF POC NONE SEEN NONE SEEN   Trich, Wet Prep NONE SEEN NONE SEEN   Clue Cells Wet Prep HPF POC NONE SEEN NONE SEEN   WBC, Wet Prep HPF POC NONE SEEN NONE SEEN    Comment: MANY BACTERIA SEEN  CBC     Status: Abnormal   Collection Time: 03/30/15  2:35 PM  Result Value Ref Range   WBC 12.7 (H) 4.0 - 10.5 K/uL   RBC 4.79 3.87 - 5.11 MIL/uL   Hemoglobin 12.7 12.0 - 15.0 g/dL   HCT 40.938.2 81.136.0 - 91.446.0 %   MCV 79.7 78.0 - 100.0 fL   MCH 26.5 26.0 - 34.0 pg   MCHC 33.2 30.0 - 36.0 g/dL   RDW 78.213.9 95.611.5 - 21.315.5 %   Platelets 269 150 - 400 K/uL    [place] Ob Comp  Less 14 Wks  03/30/2015   CLINICAL DATA:  Pregnant, sharp lower pelvic cramps  EXAM: OBSTETRIC <14 WK US AND TRANSVAGINAL OB US  TECHNIQUE: Both transabdominal and transvaginal ultrasound examinations were performed for complete evaluation of the gestation as well as the maternal uterus, adnexal regions, and pelvic cul-de-sac. Transvaginal technique was performed to assess early pregnancy.  COMPARISON:  None.  FINDINGS: Intrauterine gestational sac: Visualized/normal in shape.  Yolk sac:  Present  Embryo:  Not visualized  MSD: 7.8  mm   5 w   3  d  Maternal uterus/adnexae: No subchronic hemorrhage.  Right ovary is within normal limits, measuring 2.8 x 1.3 x 2.9 cm.  Left ovary is within normal limits, measuring 2.1 x 1.7 x 2.7 cm.  Small volume pelvic ascites.  IMPRESSION: Single intrauterine gestational sac with yolk sac, measuring 5 weeks 3 days by mean sac diameter. No fetal pole is visualized.  Serial beta HCG is suggested. Consider follow-up pelvic ultrasound in 14 days to confirm viability and for more accurate dating.   Electronically Signed   By: Charline BillsSriyesh  Krishnan M.D.   On: 03/30/2015 15:08   [place] Ob Transvaginal  03/30/2015   CLINICAL DATA:  Pregnant, sharp lower pelvic cramps  EXAM: OBSTETRIC <14 WK US AND TRANSVAGINAL OB US  TECHNIQUE: Both transabdominal and transvaginal ultrasound examinations were performed for complete evaluation of the gestation as well as the maternal uterus, adnexal regions, and pelvic cul-de-sac. Transvaginal technique was performed to assess early pregnancy.  COMPARISON:  None.  FINDINGS: Intrauterine gestational sac: Visualized/normal in shape.  Yolk sac:  Present  Embryo:  Not visualized  MSD: 7.8  mm   5 w   3  d  Maternal uterus/adnexae: No subchronic hemorrhage.  Right ovary is within normal limits, measuring 2.8 x 1.3 x 2.9 cm.  Left ovary is within normal limits, measuring 2.1 x 1.7 x 2.7 cm.  Small volume pelvic ascites.  IMPRESSION: Single intrauterine gestational sac  with yolk sac, measuring 5 weeks 3 days by mean sac diameter. No fetal pole is visualized.  Serial beta HCG is suggested. Consider follow-up pelvic ultrasound in 14 days to confirm viability and for more accurate dating.   Electronically Signed   By: Charline BillsSriyesh  Krishnan M.D.   On: 03/30/2015 15:08    Review of Systems  Constitutional: Negative for  fever and chills.  Gastrointestinal: Positive for nausea and abdominal pain. Negative for vomiting, diarrhea and constipation.  Genitourinary: Negative for dysuria, urgency and frequency.   Physical Exam   Blood pressure 113/65, pulse 87, temperature 98.9 F (37.2 C), temperature source Oral, resp. rate 18, height  (1.6 m), weight 67.132 kg (148 lb), last menstrual period 02/21/2015.  Physical Exam  Constitutional: She is oriented to person, place, and time. She appears well-developed and well-nourished. No distress.  HENT:  Head: Normocephalic.  Eyes: Pupils are equal, round, and reactive to light.  Neck: Neck supple.  Respiratory: Effort normal.  GI: Soft. There is generalized tenderness.  Genitourinary:  Speculum exam: Vagina - Small amount of creamy discharge, no odor Cervix - No contact bleeding Bimanual exam: Cervix closed Uterus non tender, normal size Adnexa non tender, no masses bilaterally GC/Chlam, wet prep done Chaperone present for exam.  Musculoskeletal: Normal range of motion.  Neurological: She is alert and oriented to person, place, and time.  Skin: Skin is warm. She is not diaphoretic.  Psychiatric: Her behavior is normal.    MAU Course  Procedures  MDM   Assessment and Plan   A:  SIUP @ [redacted]w[redacted]d Abdominal pain in pregnancy  P:  Discharge home in stable condition RX: Prenatal vitamins Start prenatal care ASAP Return to MAU if symptoms worsen Stop ibuprofen. Ok to take tylenol as needed, as directed on the bottle     Duane Lope, NP 03/30/2015 3:30 PM

## 2015-03-31 LAB — HIV ANTIBODY (ROUTINE TESTING W REFLEX): HIV SCREEN 4TH GENERATION: NONREACTIVE

## 2015-03-31 LAB — GC/CHLAMYDIA PROBE AMP (~~LOC~~) NOT AT ARMC
Chlamydia: NEGATIVE
Neisseria Gonorrhea: NEGATIVE

## 2015-04-19 LAB — OB RESULTS CONSOLE ABO/RH: RH TYPE: POSITIVE

## 2015-04-19 LAB — OB RESULTS CONSOLE GBS: STREP GROUP B AG: NEGATIVE

## 2015-04-19 LAB — OB RESULTS CONSOLE HEPATITIS B SURFACE ANTIGEN: HEP B S AG: NEGATIVE

## 2015-04-19 LAB — OB RESULTS CONSOLE RUBELLA ANTIBODY, IGM: Rubella: IMMUNE

## 2015-04-19 LAB — OB RESULTS CONSOLE RPR: RPR: NONREACTIVE

## 2015-06-23 ENCOUNTER — Encounter (HOSPITAL_COMMUNITY): Payer: Self-pay | Admitting: *Deleted

## 2015-06-23 ENCOUNTER — Emergency Department (HOSPITAL_COMMUNITY)
Admission: EM | Admit: 2015-06-23 | Discharge: 2015-06-23 | Disposition: A | Discharge status: Home / Self Care | Payer: Medicaid Other | Attending: Emergency Medicine | Admitting: Emergency Medicine

## 2015-06-23 DIAGNOSIS — Z4889 Encounter for other specified surgical aftercare: Secondary | ICD-10-CM | POA: Insufficient documentation

## 2015-06-23 DIAGNOSIS — Z79899 Other long term (current) drug therapy: Secondary | ICD-10-CM | POA: Diagnosis not present

## 2015-06-23 DIAGNOSIS — O99512 Diseases of the respiratory system complicating pregnancy, second trimester: Secondary | ICD-10-CM | POA: Diagnosis not present

## 2015-06-23 DIAGNOSIS — Z3A17 17 weeks gestation of pregnancy: Secondary | ICD-10-CM | POA: Diagnosis not present

## 2015-06-23 DIAGNOSIS — J45909 Unspecified asthma, uncomplicated: Secondary | ICD-10-CM | POA: Diagnosis not present

## 2015-06-23 DIAGNOSIS — Z7951 Long term (current) use of inhaled steroids: Secondary | ICD-10-CM | POA: Insufficient documentation

## 2015-06-23 DIAGNOSIS — O9989 Other specified diseases and conditions complicating pregnancy, childbirth and the puerperium: Secondary | ICD-10-CM | POA: Insufficient documentation

## 2015-06-23 DIAGNOSIS — Z5189 Encounter for other specified aftercare: Secondary | ICD-10-CM

## 2015-06-23 NOTE — ED Notes (Signed)
Pt reports injuring right lateral ankle approx 2 weeks ago and is afraid its infected. Abrasion noted but no redness or swelling. Pt is approx [redacted] weeks pregnant. No ob/gyn complaints.

## 2015-06-23 NOTE — Discharge Instructions (Signed)

## 2015-06-23 NOTE — ED Provider Notes (Signed)
History  This chart was scribed for non-physician practitioner, Teressa Lower, FNP,working with Elwin Mocha, MD, by Karle Plumber, ED Scribe. This patient was seen in room TR06C/TR06C and the patient's care was started at 5:48 PM.  Chief Complaint  Patient presents with  . Wound Check   The history is provided by the patient and medical records. No language interpreter was used.    HPI Comments:  Taylor Strickland is a 24 y.o. pregnant female at [redacted] weeks gestation who presents to the Emergency Department complaining of an abrasion to the lateral right ankle that she got about 2 weeks ago. She reports associated purulent drainage from the area last night. She reports only minor pain at this point. She has been applying peroxide to the wound and not covering it with any bandages. She denies modifying factors. She denies fever, chills, nausea, vomiting, numbness, tingling or weakness of the right foot, ankle or leg.  Past Medical History  Diagnosis Date  . Asthma    History reviewed. No pertinent past surgical history. History reviewed. No pertinent family history. History  Substance Use Topics  . Smoking status: Never Smoker   . Smokeless tobacco: Not on file  . Alcohol Use: No   OB History    Gravida Para Term Preterm AB TAB SAB Ectopic Multiple Living   2    1  1         Review of Systems  Constitutional: Negative for fever and chills.  Gastrointestinal: Negative for nausea and vomiting.  Skin: Positive for wound.  Neurological: Negative for weakness and numbness.  All other systems reviewed and are negative.   Allergies  Review of patient's allergies indicates no known allergies.  Home Medications   Prior to Admission medications   Medication Sig Start Date End Date Taking? Authorizing Provider  amoxicillin-clavulanate (AUGMENTIN) 875-125 MG per tablet Take 1 tablet by mouth 2 (two) times daily. Patient not taking: Reported on 03/30/2015 12/14/13   Reuben Likes, MD   fexofenadine (ALLEGRA) 60 MG tablet Take 60 mg by mouth as needed for allergies or rhinitis.    Historical Provider, MD  fluticasone (FLONASE) 50 MCG/ACT nasal spray Place 2 sprays into both nostrils daily. Patient not taking: Reported on 03/30/2015 12/14/13   Reuben Likes, MD  loratadine (CLARITIN) 10 MG tablet Take 10 mg by mouth as needed for allergies.    Historical Provider, MD  prednisoLONE acetate (PRED FORTE) 1 % ophthalmic suspension Place 1 drop into the left eye 4 (four) times daily. Patient not taking: Reported on 03/30/2015 02/21/14   Reuben Likes, MD  Prenat w/o A Vit-FeFum-FePo-FA (CONCEPT OB) 130-92.4-1 MG CAPS Take 1 tablet by mouth daily. Patient not taking: Reported on 03/30/2015 04/30/13   Dorathy Kinsman, CNM  Prenatal Vit-Fe Fumarate-FA (PRENATAL VITAMINS) 28-0.8 MG TABS Take 1 tablet by mouth daily. 03/30/15   Duane Lope, NP  traMADol (ULTRAM) 50 MG tablet 1 to 2 every 8 hours as needed for cough. Patient not taking: Reported on 03/30/2015 12/14/13   Reuben Likes, MD   Triage Vitals: BP 115/69 mmHg  Pulse 88  Temp(Src) 98.3 F (36.8 C) (Oral)  Resp 20  SpO2 98%  LMP 02/21/2015 (Exact Date) Physical Exam  Constitutional: She is oriented to person, place, and time. She appears well-developed and well-nourished.  HENT:  Head: Normocephalic and atraumatic.  Eyes: EOM are normal.  Neck: Normal range of motion.  Cardiovascular: Normal rate.   Pulmonary/Chest: Effort normal.  Musculoskeletal: Normal range  of motion.  Neurological: She is alert and oriented to person, place, and time.  Skin: Skin is warm and dry.  Abrasion to lateral aspect of right ankle. No drainage, erythema or warmth noted to the area.  Psychiatric: She has a normal mood and affect. Her behavior is normal.  Nursing note and vitals reviewed.   ED Course  Procedures (including critical care time) DIAGNOSTIC STUDIES: Oxygen Saturation is 98% on RA, normal by my interpretation.    COORDINATION OF CARE: 5:52 PM- Wound care instructions given. Will dress wound prior to discharge. Pt verbalizes understanding and agrees to plan.  Medications - No data to display  Labs Review Labs Reviewed - No data to display  Imaging Review No results found.   EKG Interpretation None      MDM   Final diagnoses:  Visit for wound check    No sign of infection. Discussed treatment of wound at home  I personally performed the services described in this documentation, which was scribed in my presence. The recorded information has been reviewed and is accurate.    Teressa Lower, NP 06/23/15 1845  Elwin Mocha, MD 06/23/15 2002

## 2015-10-03 ENCOUNTER — Inpatient Hospital Stay (HOSPITAL_COMMUNITY)
Admission: AD | Admit: 2015-10-03 | Discharge: 2015-10-03 | Disposition: A | Discharge status: Home / Self Care | Payer: Medicaid Other | Source: Ambulatory Visit | Attending: Obstetrics and Gynecology | Admitting: Obstetrics and Gynecology

## 2015-10-03 ENCOUNTER — Encounter (HOSPITAL_COMMUNITY): Payer: Self-pay

## 2015-10-03 DIAGNOSIS — O26893 Other specified pregnancy related conditions, third trimester: Secondary | ICD-10-CM | POA: Insufficient documentation

## 2015-10-03 DIAGNOSIS — J029 Acute pharyngitis, unspecified: Secondary | ICD-10-CM | POA: Diagnosis present

## 2015-10-03 DIAGNOSIS — Z3A32 32 weeks gestation of pregnancy: Secondary | ICD-10-CM | POA: Insufficient documentation

## 2015-10-03 DIAGNOSIS — J020 Streptococcal pharyngitis: Secondary | ICD-10-CM

## 2015-10-03 LAB — DIFFERENTIAL
Basophils Absolute: 0 10*3/uL (ref 0.0–0.1)
Basophils Relative: 0 %
EOS ABS: 0.1 10*3/uL (ref 0.0–0.7)
EOS PCT: 1 %
Lymphocytes Relative: 10 %
Lymphs Abs: 1.4 10*3/uL (ref 0.7–4.0)
MONOS PCT: 7 %
Monocytes Absolute: 1 10*3/uL (ref 0.1–1.0)
NEUTROS PCT: 82 %
Neutro Abs: 11.2 10*3/uL — ABNORMAL HIGH (ref 1.7–7.7)

## 2015-10-03 LAB — CBC
HCT: 36.5 % (ref 36.0–46.0)
HEMOGLOBIN: 12.1 g/dL (ref 12.0–15.0)
MCH: 27.8 pg (ref 26.0–34.0)
MCHC: 33.2 g/dL (ref 30.0–36.0)
MCV: 83.9 fL (ref 78.0–100.0)
Platelets: 160 10*3/uL (ref 150–400)
RBC: 4.35 MIL/uL (ref 3.87–5.11)
RDW: 13.6 % (ref 11.5–15.5)
WBC: 14 10*3/uL — ABNORMAL HIGH (ref 4.0–10.5)

## 2015-10-03 LAB — RAPID STREP SCREEN (MED CTR MEBANE ONLY): Streptococcus, Group A Screen (Direct): NEGATIVE

## 2015-10-03 MED ORDER — CEPHALEXIN 500 MG PO CAPS: 500.0000 mg | ORAL_CAPSULE | Freq: Two times a day (BID) | ORAL | Status: DC

## 2015-10-03 NOTE — MAU Note (Signed)
Pt C/O sore throat for several days, hurts to swallow.  Also some nausea & dizziness.  Pt denies uc's, bleeding or LOF.

## 2015-10-03 NOTE — Discharge Instructions (Signed)

## 2015-10-03 NOTE — MAU Note (Signed)
Pt reports dizziness, nausea, and sore throat starting two days ago and getting progressively worse.

## 2015-10-03 NOTE — MAU Provider Note (Signed)
Taylor Strickland is a 24 y.o. G2P0 at 32.0 weeks.  Pt arrived to MAU unannounced.  When she arrived she called the office and spoke to Ms Taylor Strickland stating that MAU would not see.  I arrived in MAU shortly after and EPIC noted that she had only arrived 3 minutes before I received the phone call.  She told the triage nurse she has a sore throat x 2 days.  She is not c/o any pregnancy issues.  When speaking to the pt she said the intake receptionist told her she could not be seen unless she called first so she called Ms Taylor Strickland from the waiting room.  She also stated that when she called the office she was denied an appointment bc "you'll are baby doctors and besides I thought I would be seen quicker by coming here".    She c/o of a sore throat, hard to swallow, no fever, no cough but productive dark phlegm.    History     There are no active problems to display for this patient.   Chief Complaint  Patient presents with  . Sore Throat  . Dizziness   HPI  OB History    Gravida Para Term Preterm AB TAB SAB Ectopic Multiple Living   2    1  1          Past Medical History  Diagnosis Date  . Asthma     Past Surgical History  Procedure Laterality Date  . Wisdom tooth extraction Bilateral 2015    Family History  Problem Relation Age of Onset  . Heart disease Mother   . Diabetes Maternal Grandmother     Social History  Substance Use Topics  . Smoking status: Never Smoker   . Smokeless tobacco: Never Used  . Alcohol Use: No    Allergies: No Known Allergies  Prescriptions prior to admission  Medication Sig Dispense Refill Last Dose  . Prenatal Vit-Fe Fumarate-FA (PRENATAL VITAMINS) 28-0.8 MG TABS Take 1 tablet by mouth daily. 30 tablet 3 10/02/2015 at Unknown time  . amoxicillin-clavulanate (AUGMENTIN) 875-125 MG per tablet Take 1 tablet by mouth 2 (two) times daily. (Patient not taking: Reported on 10/03/2015) 20 tablet 0 Not Taking at Unknown time  . fluticasone (FLONASE) 50  MCG/ACT nasal spray Place 2 sprays into both nostrils daily. (Patient not taking: Reported on 10/03/2015) 16 g 0 Not Taking at Unknown time  . prednisoLONE acetate (PRED FORTE) 1 % ophthalmic suspension Place 1 drop into the left eye 4 (four) times daily. (Patient not taking: Reported on 10/03/2015) 5 mL 0 Not Taking at Unknown time  . Prenat w/o A Vit-FeFum-FePo-FA (CONCEPT OB) 130-92.4-1 MG CAPS Take 1 tablet by mouth daily. (Patient not taking: Reported on 10/03/2015) 30 capsule 12 Not Taking at Unknown time  . traMADol (ULTRAM) 50 MG tablet 1 to 2 every 8 hours as needed for cough. (Patient not taking: Reported on 10/03/2015) 30 tablet 0     ROS See HPI above, all other systems are negative  Physical Exam   Blood pressure 109/69, pulse 93, temperature 98.1 F (36.7 C), temperature source Oral, resp. rate 18, height 5\' 3"  (1.6 m), weight 184 lb (83.462 kg), last menstrual period 02/21/2015.  Physical Exam Ext:  WNL ABD: Soft, non tender to palpation, no rebound or guarding SVE: deferred   ED Course  Assessment: IUP at  32.0 weeks Membranes: intact FHR: Category 1 CTX:  none  Plan: Labs: strept culture, CBC Consulted Dr. Stefano Strickland.  Taylor Strickland, CNM, MSN 10/03/2015. 4:06 PM   MAU Addendum Note  Results for orders placed or performed during the hospital encounter of 10/03/15 (from the past 24 hour(s))  CBC     Status: Abnormal   Collection Time: 10/03/15  3:30 PM  Result Value Ref Range   WBC 14.0 (H) 4.0 - 10.5 K/uL   RBC 4.35 3.87 - 5.11 MIL/uL   Hemoglobin 12.1 12.0 - 15.0 g/dL   HCT 16.1 09.6 - 04.5 %   MCV 83.9 78.0 - 100.0 fL   MCH 27.8 26.0 - 34.0 pg   MCHC 33.2 30.0 - 36.0 g/dL   RDW 40.9 81.1 - 91.4 %   Platelets 160 150 - 400 K/uL  Differential     Status: Abnormal   Collection Time: 10/03/15  3:30 PM  Result Value Ref Range   Neutrophils Relative % 82 %   Neutro Abs 11.2 (H) 1.7 - 7.7 K/uL   Lymphocytes Relative 10 %   Lymphs Abs 1.4 0.7 - 4.0  K/uL   Monocytes Relative 7 %   Monocytes Absolute 1.0 0.1 - 1.0 K/uL   Eosinophils Relative 1 %   Eosinophils Absolute 0.1 0.0 - 0.7 K/uL   Basophils Relative 0 %   Basophils Absolute 0.0 0.0 - 0.1 K/uL     Plan: -Discharged to home in stable condition -Discussed need to keep schedule OB appointment in the office  -Bleeding and PTL Precautions -kick counts -Encouraged to call if any questions or concerns arise prior to next scheduled office visit.  -Gargle with warm salty water -OTC throat lozenges -keflex  bid x 7 days -Push PO fluids -Consulted with Dr. Royal Hawthorn Aracelly Strickland, CNM, MSN 10/03/2015. 4:06 PM

## 2015-10-05 LAB — CULTURE, GROUP A STREP: Strep A Culture: NEGATIVE

## 2015-11-17 IMAGING — US US OB COMP LESS 14 WK
1 series · 14 of 28 positions shown · non-contrast
Comparison: None.

CLINICAL DATA: Pregnant, sharp lower pelvic cramps

EXAM:
OBSTETRIC <14 WK US AND TRANSVAGINAL OB US
TECHNIQUE: Both transabdominal and transvaginal ultrasound examinations were
performed for complete evaluation of the gestation as well as the
maternal uterus, adnexal regions, and pelvic cul-de-sac.
Transvaginal technique was performed to assess early pregnancy.

[Series 1: us ob transvaginal · 14 of 35 slices shown]
[im 2/35]
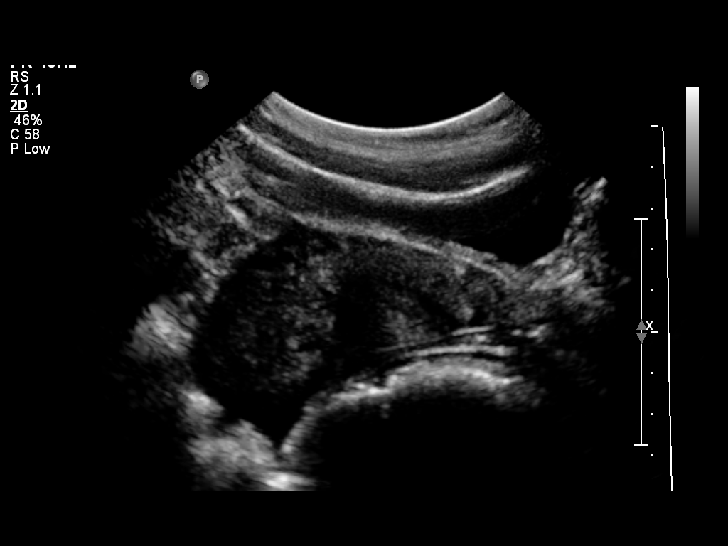
[im 4/35]
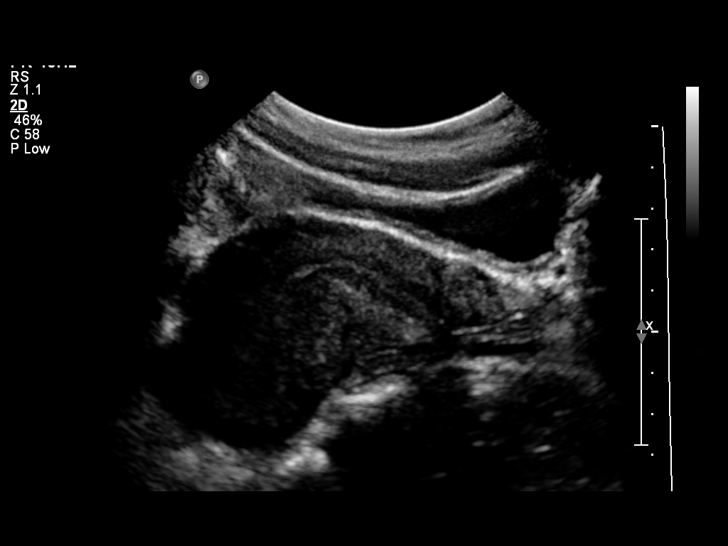
[im 7/35]
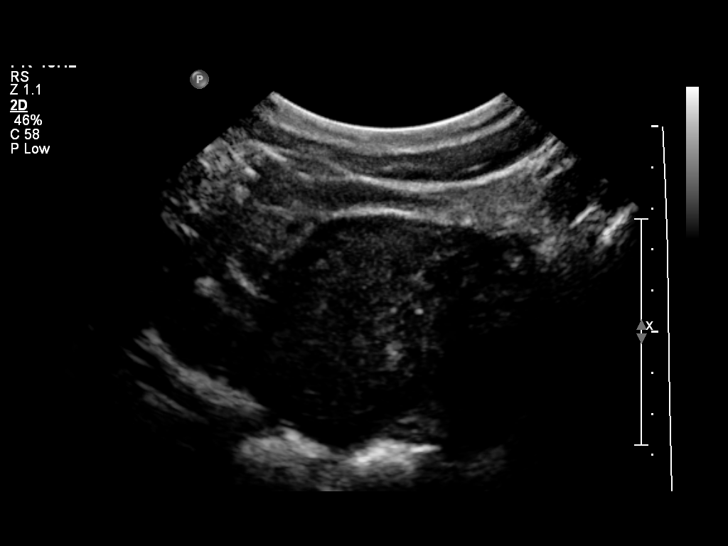
[im 9/35]
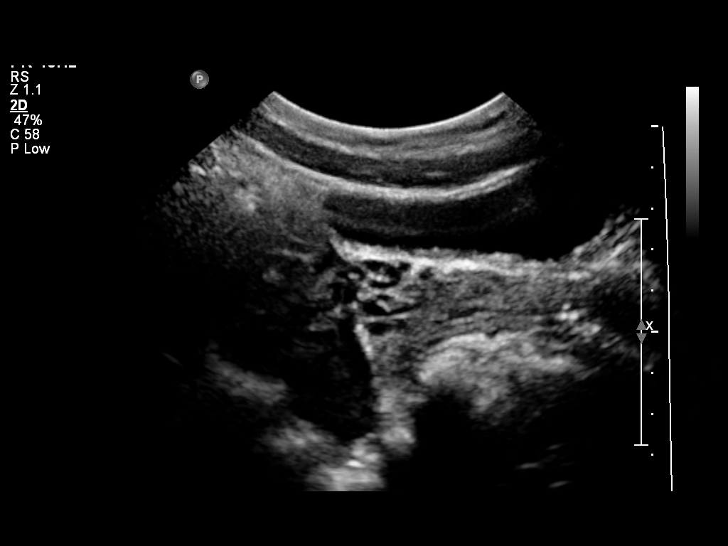
[im 12/35]
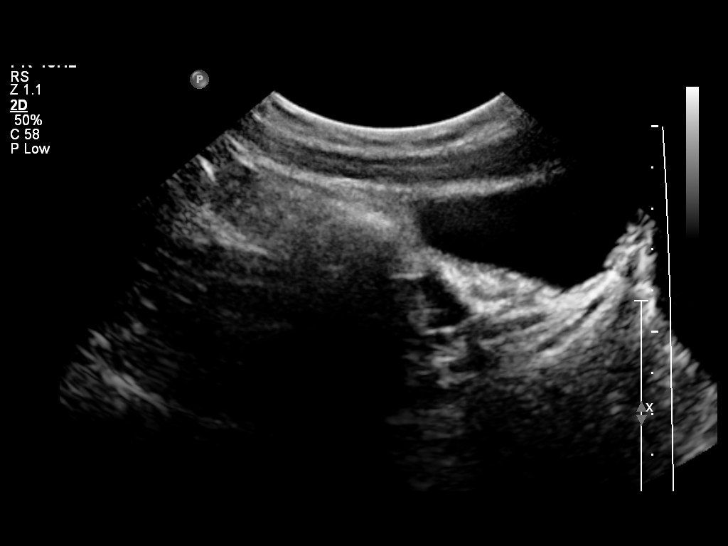
[im 14/35]
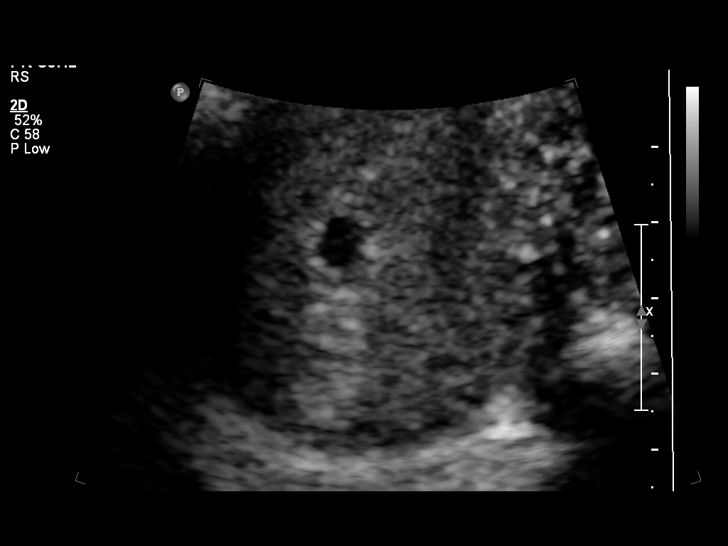
[im 17/35]
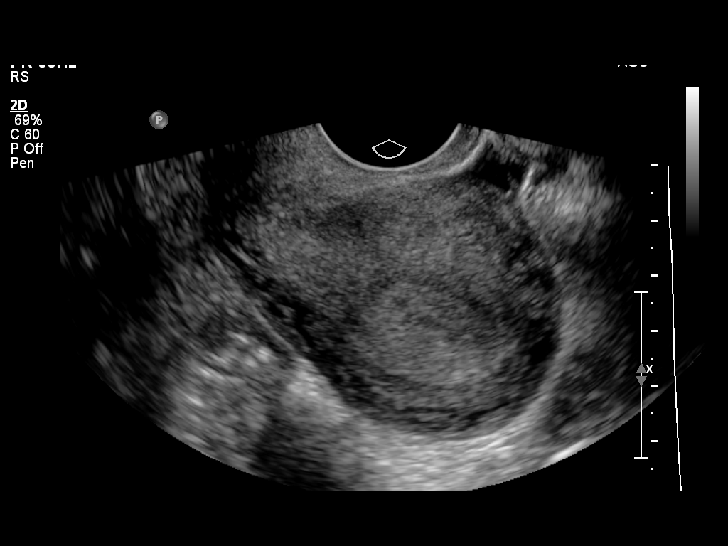
[im 19/35]
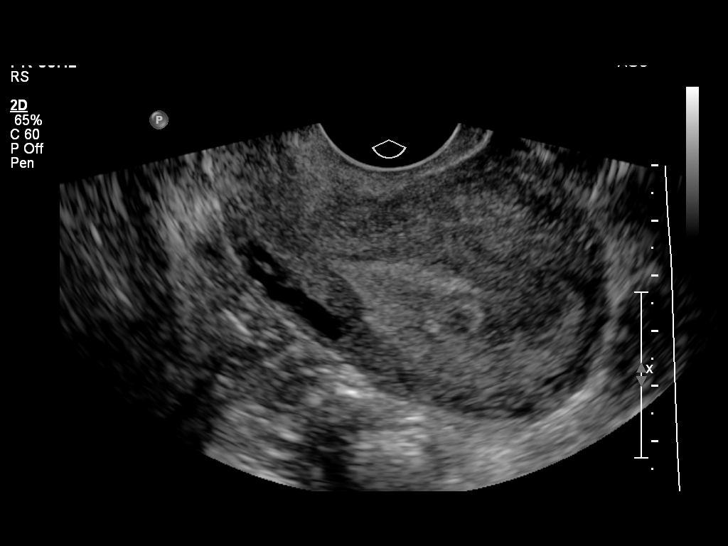
[im 22/35]
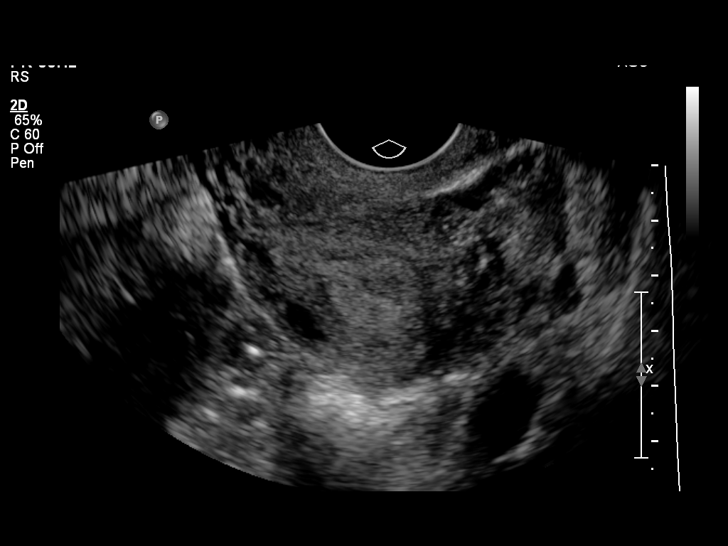
[im 24/35]
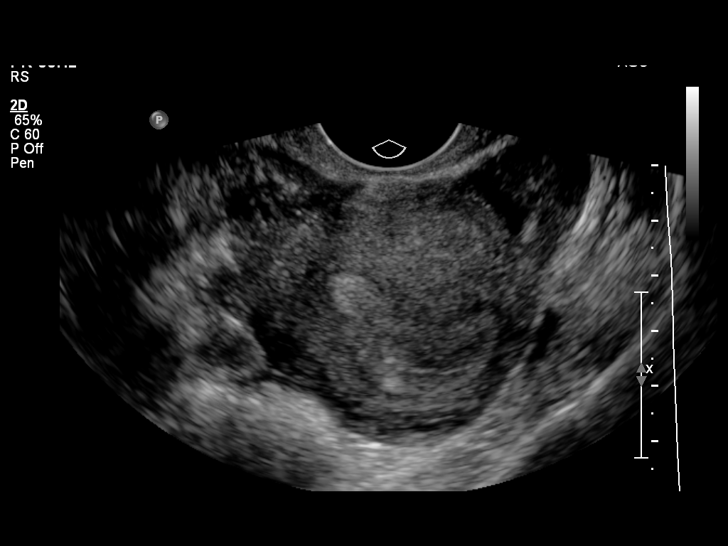
[im 27/35]
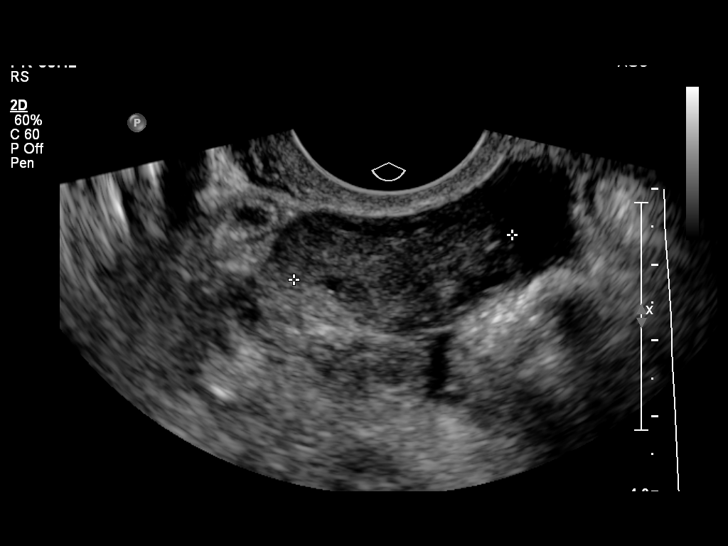
[im 29/35]
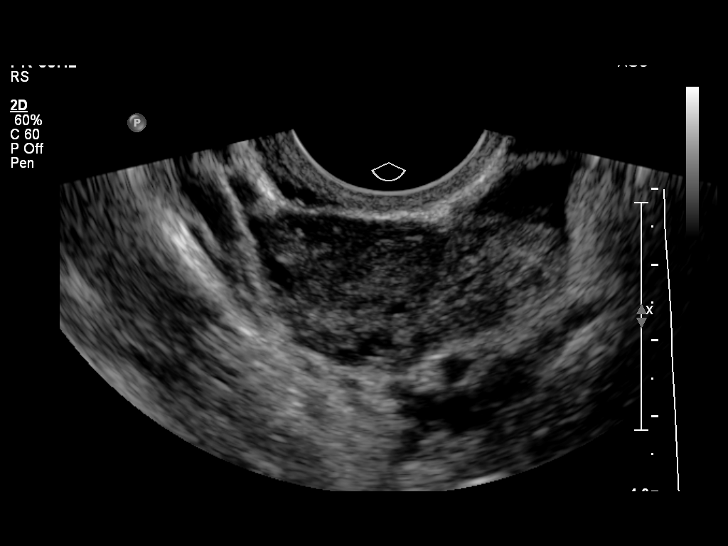
[im 32/35]
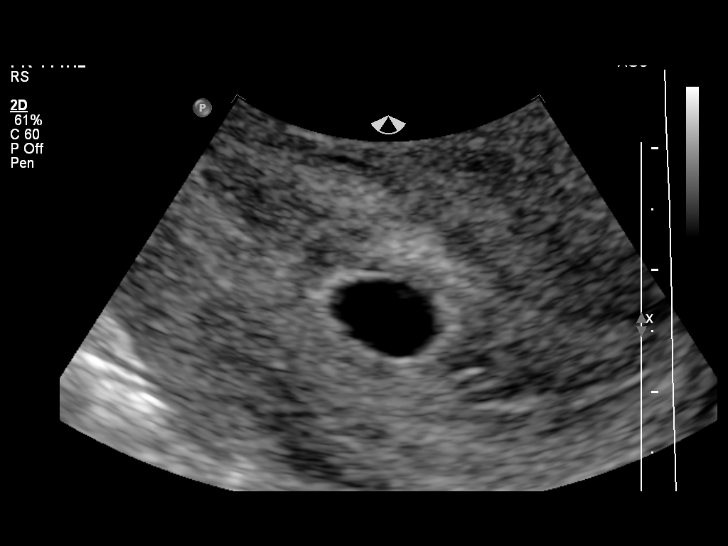
[im 35/35]
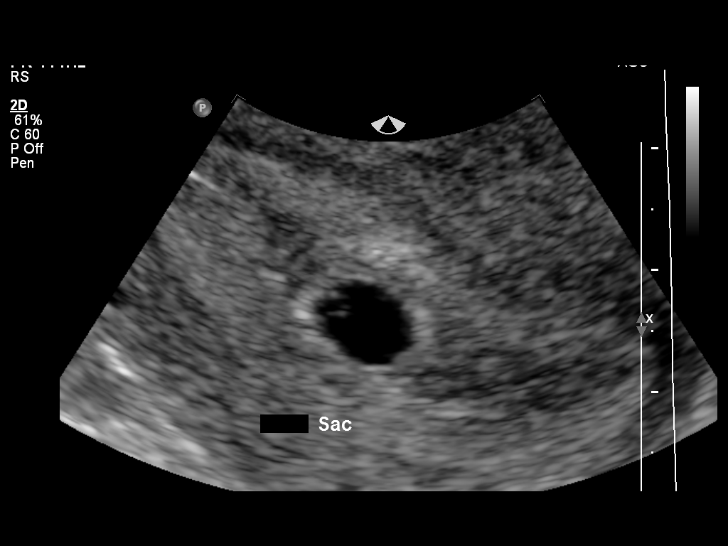

[14 of 28 positions shown; findings below may reference images not displayed]

FINDINGS: Intrauterine gestational sac: Visualized/normal in shape.

Yolk sac:  Present

Embryo:  Not visualized

MSD: 7.8  mm   5 w   3  d

Maternal uterus/adnexae: No subchronic hemorrhage.

Right ovary is within normal limits, measuring 2.8 x 1.3 x 2.9 cm.

Left ovary is within normal limits, measuring 2.1 x 1.7 x 2.7 cm.

Small volume pelvic ascites.
IMPRESSION: Single intrauterine gestational sac with yolk sac, measuring 5 weeks
3 days by mean sac diameter. No fetal pole is visualized.

Serial beta HCG is suggested. Consider follow-up pelvic ultrasound
in 14 days to confirm viability and for more accurate dating.

## 2015-11-25 ENCOUNTER — Inpatient Hospital Stay (HOSPITAL_COMMUNITY)
Admission: AD | Admit: 2015-11-25 | Discharge: 2015-11-27 | DRG: 775 | Disposition: A | Discharge status: Home / Self Care | Payer: Medicaid Other | Source: Ambulatory Visit | Attending: Obstetrics and Gynecology | Admitting: Obstetrics and Gynecology

## 2015-11-25 ENCOUNTER — Encounter (HOSPITAL_COMMUNITY): Payer: Self-pay | Admitting: *Deleted

## 2015-11-25 DIAGNOSIS — O9952 Diseases of the respiratory system complicating childbirth: Secondary | ICD-10-CM | POA: Diagnosis present

## 2015-11-25 DIAGNOSIS — O99824 Streptococcus B carrier state complicating childbirth: Secondary | ICD-10-CM | POA: Diagnosis present

## 2015-11-25 DIAGNOSIS — Z8249 Family history of ischemic heart disease and other diseases of the circulatory system: Secondary | ICD-10-CM | POA: Diagnosis not present

## 2015-11-25 DIAGNOSIS — J45909 Unspecified asthma, uncomplicated: Secondary | ICD-10-CM | POA: Diagnosis present

## 2015-11-25 DIAGNOSIS — Z3A39 39 weeks gestation of pregnancy: Secondary | ICD-10-CM

## 2015-11-25 DIAGNOSIS — D72829 Elevated white blood cell count, unspecified: Secondary | ICD-10-CM | POA: Diagnosis present

## 2015-11-25 DIAGNOSIS — O9912 Other diseases of the blood and blood-forming organs and certain disorders involving the immune mechanism complicating childbirth: Secondary | ICD-10-CM | POA: Diagnosis present

## 2015-11-25 DIAGNOSIS — IMO0001 Reserved for inherently not codable concepts without codable children: Secondary | ICD-10-CM

## 2015-11-25 DIAGNOSIS — Z833 Family history of diabetes mellitus: Secondary | ICD-10-CM | POA: Diagnosis not present

## 2015-11-25 LAB — COMPREHENSIVE METABOLIC PANEL
ALBUMIN: 3.1 g/dL — AB (ref 3.5–5.0)
ALT: 24 U/L (ref 14–54)
ANION GAP: 10 (ref 5–15)
AST: 28 U/L (ref 15–41)
Alkaline Phosphatase: 189 U/L — ABNORMAL HIGH (ref 38–126)
BILIRUBIN TOTAL: 1 mg/dL (ref 0.3–1.2)
BUN: 10 mg/dL (ref 6–20)
CO2: 21 mmol/L — AB (ref 22–32)
CREATININE: 0.67 mg/dL (ref 0.44–1.00)
Calcium: 9.2 mg/dL (ref 8.9–10.3)
Chloride: 107 mmol/L (ref 101–111)
GFR calc Af Amer: >60 mL/min (ref >60)
GFR calc non Af Amer: >60 mL/min (ref >60)
GLUCOSE: 99 mg/dL (ref 65–99)
Potassium: 4.2 mmol/L (ref 3.5–5.1)
Sodium: 138 mmol/L (ref 135–145)
Total Protein: 6.3 g/dL — ABNORMAL LOW (ref 6.5–8.1)

## 2015-11-25 LAB — CBC
HCT: 37.8 % (ref 36.0–46.0)
Hemoglobin: 12.5 g/dL (ref 12.0–15.0)
MCH: 27.6 pg (ref 26.0–34.0)
MCHC: 33.1 g/dL (ref 30.0–36.0)
MCV: 83.4 fL (ref 78.0–100.0)
Platelets: 172 10*3/uL (ref 150–400)
RBC: 4.53 MIL/uL (ref 3.87–5.11)
RDW: 14.2 % (ref 11.5–15.5)
WBC: 12.4 10*3/uL — ABNORMAL HIGH (ref 4.0–10.5)

## 2015-11-25 LAB — TYPE AND SCREEN
ABO/RH(D): A POS
ANTIBODY SCREEN: NEGATIVE

## 2015-11-25 LAB — URIC ACID: Uric Acid, Serum: 4.7 mg/dL (ref 2.3–6.6)

## 2015-11-25 LAB — PROTEIN / CREATININE RATIO, URINE
CREATININE, URINE: 216 mg/dL
Protein Creatinine Ratio: 0.15 mg/mg{Cre} (ref 0.00–0.15)
Total Protein, Urine: 32 mg/dL

## 2015-11-25 LAB — LACTATE DEHYDROGENASE: LDH: 227 U/L — ABNORMAL HIGH (ref 98–192)

## 2015-11-25 MED ORDER — LACTATED RINGERS IV SOLN
500.0000 mL | INTRAVENOUS | Status: DC | PRN
Start: 2015-11-25 — End: 2015-11-27

## 2015-11-25 MED ORDER — NALBUPHINE HCL 10 MG/ML IJ SOLN
10.0000 mg | INTRAMUSCULAR | Status: DC | PRN
  Administered 2015-11-25 (×3): 10 mg via INTRAVENOUS
  Filled 2015-11-25 (×3): qty 1

## 2015-11-25 MED ORDER — OXYCODONE-ACETAMINOPHEN 5-325 MG PO TABS: 1.0000 | ORAL_TABLET | ORAL | Status: DC | PRN

## 2015-11-25 MED ORDER — LACTATED RINGERS IV BOLUS (SEPSIS)
999.0000 mL | Freq: Once | INTRAVENOUS | Status: AC
Start: 2015-11-25 — End: 2015-11-25
  Administered 2015-11-25: 500 mL via INTRAVENOUS

## 2015-11-25 MED ORDER — CITRIC ACID-SODIUM CITRATE 334-500 MG/5ML PO SOLN: 30.0000 mL | ORAL | Status: DC | PRN

## 2015-11-25 MED ORDER — LACTATED RINGERS IV SOLN
INTRAVENOUS | Status: DC
  Administered 2015-11-25: 20:00:00 via INTRAVENOUS

## 2015-11-25 MED ORDER — PROMETHAZINE HCL 25 MG/ML IJ SOLN
12.5000 mg | Freq: Four times a day (QID) | INTRAMUSCULAR | Status: DC | PRN
  Administered 2015-11-25 (×2): 12.5 mg via INTRAVENOUS
  Filled 2015-11-25 (×2): qty 1

## 2015-11-25 MED ORDER — OXYTOCIN 40 UNITS IN LACTATED RINGERS INFUSION - SIMPLE MED
1.0000 m[IU]/min | INTRAVENOUS | Status: DC
  Administered 2015-11-25: 2 m[IU]/min via INTRAVENOUS
  Filled 2015-11-25: qty 1000

## 2015-11-25 MED ORDER — TERBUTALINE SULFATE 1 MG/ML IJ SOLN
0.2500 mg | Freq: Once | INTRAMUSCULAR | Status: DC | PRN
Start: 2015-11-25 — End: 2015-11-26
  Filled 2015-11-25: qty 1

## 2015-11-25 MED ORDER — NALBUPHINE HCL 10 MG/ML IJ SOLN
5.0000 mg | INTRAMUSCULAR | Status: DC | PRN
Start: 2015-11-25 — End: 2015-11-27
  Administered 2015-11-26: 5 mg via INTRAVENOUS
  Filled 2015-11-25: qty 1

## 2015-11-25 MED ORDER — OXYCODONE-ACETAMINOPHEN 5-325 MG PO TABS: 2.0000 | ORAL_TABLET | ORAL | Status: DC | PRN

## 2015-11-25 MED ORDER — OXYTOCIN BOLUS FROM INFUSION: 500.0000 mL | INTRAVENOUS | Status: DC

## 2015-11-25 MED ORDER — ACETAMINOPHEN 325 MG PO TABS
650.0000 mg | ORAL_TABLET | ORAL | Status: DC | PRN
  Administered 2015-11-26: 650 mg via ORAL
  Filled 2015-11-25 (×2): qty 2

## 2015-11-25 MED ORDER — ONDANSETRON HCL 4 MG/2ML IJ SOLN: 4.0000 mg | Freq: Four times a day (QID) | INTRAMUSCULAR | Status: DC | PRN

## 2015-11-25 MED ORDER — LIDOCAINE HCL (PF) 1 % IJ SOLN
30.0000 mL | INTRAMUSCULAR | Status: DC | PRN
  Administered 2015-11-25: 30 mL via SUBCUTANEOUS
  Filled 2015-11-25: qty 30

## 2015-11-25 MED ORDER — OXYTOCIN 40 UNITS IN LACTATED RINGERS INFUSION - SIMPLE MED
62.5000 mL/h | INTRAVENOUS | Status: DC
  Administered 2015-11-25: 999 mL/h via INTRAVENOUS

## 2015-11-25 NOTE — Progress Notes (Signed)
Labor Progress  Subjective: Managing ctx with breathing and family support.  Pt request to get in the tub  Objective: BP 142/88 mmHg  Pulse 70  Temp(Src) 98 F (36.7 C) (Oral)  Resp 18  Ht 5\' 2"  (1.575 m)  Wt 190 lb (86.183 kg)  BMI 34.74 kg/m2  LMP 02/21/2015 (Exact Date)     FHT: 140, moderate variability, + accel, occasional variable decel CTX:  regular, every 3-5 minutes Uterus gravid, soft non tender SVE:  6/90/-2  Assessment:  IUP at 39.4 weeks NICHD: Category 1 Membranes: BBW Labor progress: active Pitocin Augmentation ZOX:WRUEAVWUGBS:positive   Plan: Continue labor plan intermittent monitoring Pt may shower Rest/Ambulate Frequent position changes to facilitate fetal rotation and descent. Will reassess with cervical exam at 1700 or earlier if necessary        Cheron Coryell, CNM, MSN 11/25/2015. 3:21 PM

## 2015-11-25 NOTE — Progress Notes (Signed)
Addendum Results for orders placed or performed during the hospital encounter of 11/25/15 (from the past 24 hour(s))  CBC     Status: Abnormal   Collection Time: 11/25/15  9:10 AM  Result Value Ref Range   WBC 12.4 (H) 4.0 - 10.5 K/uL   RBC 4.53 3.87 - 5.11 MIL/uL   Hemoglobin 12.5 12.0 - 15.0 g/dL   HCT 16.137.8 09.636.0 - 04.546.0 %   MCV 83.4 78.0 - 100.0 fL   MCH 27.6 26.0 - 34.0 pg   MCHC 33.1 30.0 - 36.0 g/dL   RDW 40.914.2 81.111.5 - 91.415.5 %   Platelets 172 150 - 400 K/uL  Comprehensive metabolic panel     Status: Abnormal   Collection Time: 11/25/15  9:10 AM  Result Value Ref Range   Sodium 138 135 - 145 mmol/L   Potassium 4.2 3.5 - 5.1 mmol/L   Chloride 107 101 - 111 mmol/L   CO2 21 (L) 22 - 32 mmol/L   Glucose, Bld 99 65 - 99 mg/dL   BUN 10 6 - 20 mg/dL   Creatinine, Ser 7.820.67 0.44 - 1.00 mg/dL   Calcium 9.2 8.9 - 95.610.3 mg/dL   Total Protein 6.3 (L) 6.5 - 8.1 g/dL   Albumin 3.1 (L) 3.5 - 5.0 g/dL   AST 28 15 - 41 U/L   ALT 24 14 - 54 U/L   Alkaline Phosphatase 189 (H) 38 - 126 U/L   Total Bilirubin 1.0 0.3 - 1.2 mg/dL   GFR calc non Af Amer >60 >60 mL/min   GFR calc Af Amer >60 >60 mL/min   Anion gap 10 5 - 15  Lactate dehydrogenase     Status: Abnormal   Collection Time: 11/25/15  9:10 AM  Result Value Ref Range   LDH 227 (H) 98 - 192 U/L  Uric acid     Status: None   Collection Time: 11/25/15  9:10 AM  Result Value Ref Range   Uric Acid, Serum 4.7 2.3 - 6.6 mg/dL  [facility] and [facility]     Status: None   Collection Time: 11/25/15  9:10 AM  Result Value Ref Range   ABO/RH(D) A POS    Antibody Screen NEG    Sample Expiration 11/28/2015   Protein / creatinine ratio, urine     Status: None   Collection Time: 11/25/15 12:33 PM  Result Value Ref Range   Creatinine, Urine 216.00 mg/dL   Total Protein, Urine 32 mg/dL   Protein Creatinine Ratio 0.15 0.00 - 0.15 mg/mg[Cre]

## 2015-11-25 NOTE — Progress Notes (Signed)
Labor Progress  Subjective: Very uncomfortable with each ctx.  Discussed pain mgmt option.  Pt does not want an epidural  Objective: BP 132/84 mmHg  Pulse 81  Temp(Src) 98 F (36.7 C) (Oral)  Resp 18  Ht 5\' 2"  (1.575 m)  Wt 190 lb (86.183 kg)  BMI 34.74 kg/m2  LMP 02/21/2015 (Exact Date)     FHT: 140, moderate variability, + accel,  No decel CTX:  regular, every 4-6 minutes Uterus gravid, soft non tender SVE:  Dilation: 6.5 Effacement (%): 90 Station: -2 Exam by:: Harjit Leider cnm Balotable  Assessment:  IUP at 39.4 weeks NICHD: Category 1 Membranes:  BBW  Labor progress: active ZOX:WRUEAVWUGBS:negative  Plan: Continue labor plan Continuous monitoring Ambulate Frequent position changes to facilitate fetal rotation and descent. Start pitocin 2x2       Taylor Strickland, CNM, MSN 11/25/2015. 6:27 PM

## 2015-11-25 NOTE — Progress Notes (Signed)
Taylor Strickland MRN: [id number]  Subjective: -Care assumed of 24 y.o. G2P0 at 39.4wks who presents for active labor.  In room to meet acquaintance and discuss POC.  Patient resting in bed, breathing well through contractions.  Currently utilizing IV medications for pain mgmt.    Objective: BP 131/83 mmHg  Pulse 83  Temp(Src) 98.3 F (36.8 C) (Oral)  Resp 18  Ht 5\' 2"  (1.575 m)  Wt 86.183 kg (190 lb)  BMI 34.74 kg/m2  LMP 02/21/2015 (Exact Date)      Fetal Monitoring: FHT: 135 bpm, Mod Var, -Decels, -Accels UC: Q1-912min, palpates moderate    Vaginal Exam: SVE:   Dilation: 8 Effacement (%): 90 Station: -2 Exam by:: Angeldejesus Callaham, CNM Membranes:AROM of moderate amt clear fluid Internal Monitors: None  Augmentation/Induction: Pitocin:856mUn/min Cytotec: None  Assessment:  IUP at 39.4wks Cat I FT  Labor Augmentation Transition Phase Coping Well GBS Negative  Plan: -Discussed AROM r/b including increased risk of infection, cord prolapse, fetal intolerance, and decreased labor time. No questions or concerns and patient desires to proceed with AROM.  -Will continue to monitor -Continue other mgmt as ordered  Valma CavaJessica L Norville Dani,MSN, CNM 11/25/2015, 9:50 PM

## 2015-11-25 NOTE — H&P (Signed)
Taylor Strickland is a 24 y.o. female, G2 P0010 at 39.4 weeks  Patient Active Problem List   Diagnosis Date Noted  . Normal labor 11/25/2015    Pregnancy Course: Patient entered care at 10.1 weeks.   EDC of 11/28/15 was established by LMP.      [place]   18.0 weeks - Anatomy:EFW= 8 oz. = 36th %tile.  FHT 156 bpm. . Vertex pres. Posterior placenta. Placental edge to cx= 5.2 cm. PCI seen. Normaal fluid. AP pocket= 3.3 cm. Meas: c/w  LMP GA.  All anatomy seen.  Gender appears Female.      Significant prenatal events:   asthma   Last evaluation:   39.0 weeks   VE:2/50/-2 on 11/21/15  Reason for admission:  labor  Pt States:   Contractions Frequency: 3-4         Contraction severity: strong         Fetal activity: +FM  OB History    Gravida Para Term Preterm AB TAB SAB Ectopic Multiple Living   Past Medical History  Diagnosis Date  . Asthma    Past Surgical History  Procedure Laterality Date  . Wisdom tooth extraction Bilateral 2015   Family History: family history includes Diabetes in her maternal grandmother; Heart disease in her mother. Social History:  reports that she has never smoked. She has never used smokeless tobacco. She reports that she does not drink alcohol or use illicit drugs.   Prenatal Transfer Tool  Maternal Diabetes: No Genetic Screening: Normal Maternal Ultrasounds/Referrals: Normal Fetal Ultrasounds or other Referrals:  None Maternal Substance Abuse:  No Significant Maternal Medications:  None Significant Maternal Lab Results: None   ROS:  See HPI above, all other systems are negative  No Known Allergies  Dilation: 5 Effacement (%): 90 Station: -2 Exam by:: Malli Falotico cnm Blood pressure 139/95, pulse 73, temperature 98 F (36.7 C), temperature source Oral, resp. rate 20, height  (1.575 m), weight 190 lb (86.183 kg), last menstrual period 02/21/2015.  Maternal Exam:  Uterine Assessment: Contraction frequency is rare.  Abdomen:  Gravid, non tender. Fundal height is aga.  Normal external genitalia, vulva, cervix, uterus and adnexa.  No lesions noted on exam.  Pelvis adequate for delivery.  Fetal presentation: Vertex by VE  Fetal Exam:  Monitor Surveillance : Continuous Monitoring / Intermitting per -  Mode: Ultrasound.  NICHD: Category 1 CTXs: Q 3-69minutes EFW   7 lbs  Physical Exam: Nursing note and vitals reviewed General: alert and cooperative She appears well nourished Psychiatric: Normal mood and affect. Her behavior is normal Head: Normocephalic Eyes: Pupils are equal, round, and reactive to light Neck: Normal range of motion Cardiovascular: RRR without murmur  Respiratory: CTAB. Effort normal  Abd: soft, non-tender, +BS, no rebound, no guarding  Genitourinary: Vagina normal  Neurological: A&Ox3 Skin: Warm and dry  Musculoskeletal: Normal range of motion  Homan's sign negative bilaterally No evidence of DVTs.  Edema: Minimal bilaterally non-pitting edema DTR: 2+ Clonus: None   Prenatal labs: ABO, Rh:  A positive Antibody:  negative Rubella:  immune RPR:   NR HBsAg:    HIV: Non Reactive (04/27 1435)  GBS:  negative Sickle cell/Hgb electrophoresis:  WNL Pap:  Neg 05/03/15 GC:   negative Chlamydia: negative Genetic screenings:   Glucola:  wnl  Assessment:  IUP at 39.4 weeks NICHD: Category 1 Membranes: BBW GBS negative  Plan:  Admit to L&D  for expectant management of labor. Possible augmentation options reviewed including pitocin.  IV pain medication per orders PRN Epidural per patient request Foley cath after patient is comfortable with epidural Anticipate SVD Labor mgmt as ordered Okay to ambulate around unit with wireless monitors  Okay to get up and shower without monitoring   May auscultate FHR intermittently,  if expectant management     q 30 min in active labor - x 5 minutes     q 15 min in transition - before during and after a ctx     q 5 min with pushing -  before during and after a ctx.     May ambulate without monitoring.     If no active labor, may do NST q 2 hours.   Attending MD available at all times.     Julianne Chamberlin, CNM, MSN 11/25/2015, 11:23 AM

## 2015-11-25 NOTE — Progress Notes (Addendum)
Taylor Strickland is a 24 y.o. G2P0 at 39.4 weeks c/o ctx since 0800, n/v..  She denies vb, lof w/+FM.  Reports her last check she was 2cm   History     There are no active problems to display for this patient.   No chief complaint on file.  HPI  OB History    Gravida Para Term Preterm AB TAB SAB Ectopic Multiple Living   2    1  1          Past Medical History  Diagnosis Date  . Asthma     Past Surgical History  Procedure Laterality Date  . Wisdom tooth extraction Bilateral 2015    Family History  Problem Relation Age of Onset  . Heart disease Mother   . Diabetes Maternal Grandmother     Social History  Substance Use Topics  . Smoking status: Never Smoker   . Smokeless tobacco: Never Used  . Alcohol Use: No    Allergies: No Known Allergies  Prescriptions prior to admission  Medication Sig Dispense Refill Last Dose  . albuterol (PROVENTIL HFA;VENTOLIN HFA) 108 (90 BASE) MCG/ACT inhaler Inhale 1-2 puffs into the lungs every 6 (six) hours as needed for wheezing or shortness of breath.   prn  . Prenatal Vit-Fe Fumarate-FA (PRENATAL VITAMINS) 28-0.8 MG TABS Take 1 tablet by mouth daily. 30 tablet 3 11/24/2015 at Unknown time  . cephALEXin (KEFLEX) 500 MG capsule Take 1 capsule (500 mg total) by mouth 2 (two) times daily. (Patient not taking: Reported on 11/25/2015) 14 capsule 0 Not Taking at Unknown time  . fluticasone (FLONASE) 50 MCG/ACT nasal spray Place 2 sprays into both nostrils daily. (Patient not taking: Reported on 10/03/2015) 16 g 0 Not Taking at Unknown time  . prednisoLONE acetate (PRED FORTE) 1 % ophthalmic suspension Place 1 drop into the left eye 4 (four) times daily. (Patient not taking: Reported on 10/03/2015) 5 mL 0 Not Taking at Unknown time  . traMADol (ULTRAM) 50 MG tablet 1 to 2 every 8 hours as needed for cough. (Patient not taking: Reported on 10/03/2015) 30 tablet 0     ROS See HPI above, all other systems are negative  Physical Exam   Blood  pressure 134/101, temperature 98 F (36.7 C), temperature source Oral, resp. rate 18, height 5\' 2"  (1.575 m), weight 190 lb (86.183 kg), last menstrual period 02/21/2015.  Physical Exam Ext:  WNL ABD: Soft, non tender to palpation, no rebound or guarding SVE: 3/80/-3   ED Course  Assessment: IUP at  39.4weeks Membranes: intact FHR: Category 1 CTX:  2-3 minutes   Plan: IV fluids Phenergan and Nubain Labs: [facility] labs     Lachina Salsberry, CNM, MSN 11/25/2015. 9:04 AM  Ve 4/80/-2 BBW Admit to L&D

## 2015-11-25 NOTE — MAU Note (Signed)
Called Taylor Strickland CNM patient here for labor evaluation uncomfortable, CNM to evaluate patient in room 10 in MAU

## 2015-11-26 ENCOUNTER — Encounter (HOSPITAL_COMMUNITY): Payer: Self-pay | Admitting: *Deleted

## 2015-11-26 DIAGNOSIS — IMO0001 Reserved for inherently not codable concepts without codable children: Secondary | ICD-10-CM

## 2015-11-26 LAB — CBC
HEMATOCRIT: 32.9 % — AB (ref 36.0–46.0)
HEMOGLOBIN: 10.9 g/dL — AB (ref 12.0–15.0)
MCH: 27.8 pg (ref 26.0–34.0)
MCHC: 33.1 g/dL (ref 30.0–36.0)
MCV: 83.9 fL (ref 78.0–100.0)
Platelets: 159 10*3/uL (ref 150–400)
RBC: 3.92 MIL/uL (ref 3.87–5.11)
RDW: 14.2 % (ref 11.5–15.5)
WBC: 20.4 10*3/uL — AB (ref 4.0–10.5)

## 2015-11-26 LAB — RPR: RPR: NONREACTIVE

## 2015-11-26 MED ORDER — TETANUS-DIPHTH-ACELL PERTUSSIS 5-2.5-18.5 LF-MCG/0.5 IM SUSP: 0.5000 mL | Freq: Once | INTRAMUSCULAR | Status: DC

## 2015-11-26 MED ORDER — LANOLIN HYDROUS EX OINT: TOPICAL_OINTMENT | CUTANEOUS | Status: DC | PRN

## 2015-11-26 MED ORDER — ACETAMINOPHEN 325 MG PO TABS
650.0000 mg | ORAL_TABLET | ORAL | Status: DC | PRN
Start: 2015-11-26 — End: 2015-11-26

## 2015-11-26 MED ORDER — DIBUCAINE 1 % RE OINT: 1.0000 "application " | TOPICAL_OINTMENT | RECTAL | Status: DC | PRN

## 2015-11-26 MED ORDER — ALBUTEROL SULFATE (2.5 MG/3ML) 0.083% IN NEBU: 3.0000 mL | INHALATION_SOLUTION | Freq: Four times a day (QID) | RESPIRATORY_TRACT | Status: DC | PRN

## 2015-11-26 MED ORDER — PRENATAL MULTIVITAMIN CH
1.0000 | ORAL_TABLET | Freq: Every day | ORAL | Status: DC
  Administered 2015-11-26: 1 via ORAL
  Filled 2015-11-26: qty 1

## 2015-11-26 MED ORDER — MISOPROSTOL 200 MCG PO TABS
1000.0000 ug | ORAL_TABLET | Freq: Once | ORAL | Status: AC
Start: 2015-11-26 — End: 2015-11-26
  Administered 2015-11-26: 1000 ug via RECTAL

## 2015-11-26 MED ORDER — BENZOCAINE-MENTHOL 20-0.5 % EX AERO
1.0000 "application " | INHALATION_SPRAY | CUTANEOUS | Status: DC | PRN
  Administered 2015-11-26: 1 via TOPICAL
  Filled 2015-11-26: qty 56

## 2015-11-26 MED ORDER — LACTATED RINGERS IV SOLN: INTRAVENOUS | Status: DC

## 2015-11-26 MED ORDER — SIMETHICONE 80 MG PO CHEW: 80.0000 mg | CHEWABLE_TABLET | ORAL | Status: DC | PRN

## 2015-11-26 MED ORDER — ONDANSETRON HCL 4 MG PO TABS: 4.0000 mg | ORAL_TABLET | ORAL | Status: DC | PRN

## 2015-11-26 MED ORDER — FLEET ENEMA 7-19 GM/118ML RE ENEM
1.0000 | ENEMA | Freq: Once | RECTAL | Status: DC
Start: 2015-11-26 — End: 2015-11-26

## 2015-11-26 MED ORDER — ONDANSETRON HCL 4 MG/2ML IJ SOLN: 4.0000 mg | INTRAMUSCULAR | Status: DC | PRN

## 2015-11-26 MED ORDER — DIPHENHYDRAMINE HCL 25 MG PO CAPS: 25.0000 mg | ORAL_CAPSULE | Freq: Four times a day (QID) | ORAL | Status: DC | PRN

## 2015-11-26 MED ORDER — ZOLPIDEM TARTRATE 5 MG PO TABS: 5.0000 mg | ORAL_TABLET | Freq: Every evening | ORAL | Status: DC | PRN

## 2015-11-26 MED ORDER — OXYCODONE-ACETAMINOPHEN 5-325 MG PO TABS: 1.0000 | ORAL_TABLET | ORAL | Status: DC | PRN

## 2015-11-26 MED ORDER — OXYTOCIN 40 UNITS IN LACTATED RINGERS INFUSION - SIMPLE MED: 62.5000 mL/h | INTRAVENOUS | Status: DC

## 2015-11-26 MED ORDER — OXYCODONE-ACETAMINOPHEN 5-325 MG PO TABS: 2.0000 | ORAL_TABLET | ORAL | Status: DC | PRN

## 2015-11-26 MED ORDER — OXYTOCIN BOLUS FROM INFUSION: 500.0000 mL | INTRAVENOUS | Status: DC

## 2015-11-26 MED ORDER — IBUPROFEN 600 MG PO TABS
600.0000 mg | ORAL_TABLET | Freq: Four times a day (QID) | ORAL | Status: DC
  Administered 2015-11-26 – 2015-11-27 (×6): 600 mg via ORAL
  Filled 2015-11-26 (×6): qty 1

## 2015-11-26 MED ORDER — MISOPROSTOL 200 MCG PO TABS
ORAL_TABLET | ORAL | Status: AC
  Filled 2015-11-26: qty 5

## 2015-11-26 MED ORDER — WITCH HAZEL-GLYCERIN EX PADS: 1.0000 "application " | MEDICATED_PAD | CUTANEOUS | Status: DC | PRN

## 2015-11-26 MED ORDER — LIDOCAINE HCL (PF) 1 % IJ SOLN: 30.0000 mL | INTRAMUSCULAR | Status: DC | PRN

## 2015-11-26 MED ORDER — LACTATED RINGERS IV SOLN: 500.0000 mL | INTRAVENOUS | Status: DC | PRN

## 2015-11-26 MED ORDER — ONDANSETRON HCL 4 MG/2ML IJ SOLN: 4.0000 mg | Freq: Four times a day (QID) | INTRAMUSCULAR | Status: DC | PRN

## 2015-11-26 MED ORDER — CITRIC ACID-SODIUM CITRATE 334-500 MG/5ML PO SOLN: 30.0000 mL | ORAL | Status: DC | PRN

## 2015-11-26 MED ORDER — ACETAMINOPHEN 325 MG PO TABS
650.0000 mg | ORAL_TABLET | ORAL | Status: DC | PRN
  Administered 2015-11-26: 650 mg via ORAL

## 2015-11-26 MED ORDER — SENNOSIDES-DOCUSATE SODIUM 8.6-50 MG PO TABS
2.0000 | ORAL_TABLET | ORAL | Status: DC
  Administered 2015-11-27: 2 via ORAL
  Filled 2015-11-26: qty 2

## 2015-11-26 NOTE — Progress Notes (Signed)
Mother took off bracelets so FOB mother and baby rebanded.

## 2015-11-26 NOTE — Lactation Note (Signed)
This note was copied from the chart of Taylor Jerene Cannyshley Coale. Lactation Consultation Note  Patient Name: Taylor Strickland ZOXWR'UToday's Date: 11/26/2015 Reason for consult: Initial assessment Baby at 17 hr of life and mom reports bf is going well when the baby stays awake. Denies breast or nipple pain. Mom does have short shaft nipples but her breast are easily compressible and baby latches well. Discussed baby behavior, feeding frequency, pumping, baby belly size, voids, wt loss, breast changes, and nipple care. Demonstrated manual expression, colostrum noted bilaterally. Given lactation handouts. Aware of OP services and support group.      Maternal Data    Feeding Feeding Type: Breast Fed Length of feed: 20 min (on and off)  LATCH Score/Interventions Latch: Repeated attempts needed to sustain latch, nipple held in mouth throughout feeding, stimulation needed to elicit sucking reflex. Intervention(s): Breast compression  Audible Swallowing: Spontaneous and intermittent Intervention(s): Hand expression;Skin to skin  Type of Nipple: Everted at rest and after stimulation (short shaft) Intervention(s): No intervention needed  Comfort (Breast/Nipple): Soft / non-tender     Hold (Positioning): No assistance needed to correctly position infant at breast.  LATCH Score: 9  Lactation Tools Discussed/Used WIC Program: Yes   Consult Status Consult Status: Follow-up Date: 11/27/15 Follow-up type: In-patient    Taylor Strickland 11/26/2015, 4:54 PM

## 2015-11-26 NOTE — Progress Notes (Signed)
Subjective: Postpartum Day 1: Vaginal delivery, vaginal laceration extending into left labia Patient up ad lib, reports no syncope or dizziness. Feeding:  Breast Contraceptive plan:  Undecided at present  Objective: Vital signs in last 24 hours: Temp:  [97.1 F (36.2 C)-99.7 F (37.6 C)] 99.1 F (37.3 C) (12/24 0644) Pulse Rate:  [70-129] 99 (12/24 0644) Resp:  [18-20] 18 (12/24 0644) BP: (113-146)/(70-102) 132/82 mmHg (12/24 [id number])  Physical Exam:  General: alert Lochia: appropriate Uterine Fundus: firm Perineum: healing well DVT Evaluation: No evidence of DVT seen on physical exam. Negative Homan's sign.   CBC Latest Ref Rng 11/26/2015 11/25/2015 10/03/2015  WBC 4.0 - 10.5 K/uL 20.4(H) 12.4(H) 14.0(H)  Hemoglobin 12.0 - 15.0 g/dL 10.9(L) 12.5 12.1  Hematocrit 36.0 - 46.0 % 32.9(L) 37.8 36.5  Platelets 150 - 400 K/uL 159 172 160     Assessment/Plan: Status post vaginal delivery day 1. Leukocytosis without s/s infection Stable Continue current care. Repeat CBC in am. Plan for discharge tomorrow    Nyra CapesLATHAM, VICKICNM 11/26/2015, 10:49 AM

## 2015-11-27 LAB — CBC WITH DIFFERENTIAL/PLATELET
Basophils Absolute: 0 10*3/uL (ref 0.0–0.1)
Basophils Relative: 0 %
EOS ABS: 0.1 10*3/uL (ref 0.0–0.7)
EOS PCT: 1 %
HCT: 32.1 % — ABNORMAL LOW (ref 36.0–46.0)
Hemoglobin: 10.5 g/dL — ABNORMAL LOW (ref 12.0–15.0)
LYMPHS ABS: 3.2 10*3/uL (ref 0.7–4.0)
Lymphocytes Relative: 23 %
MCH: 27.6 pg (ref 26.0–34.0)
MCHC: 32.7 g/dL (ref 30.0–36.0)
MCV: 84.5 fL (ref 78.0–100.0)
MONOS PCT: 5 %
Monocytes Absolute: 0.6 10*3/uL (ref 0.1–1.0)
Neutro Abs: 9.8 10*3/uL — ABNORMAL HIGH (ref 1.7–7.7)
Neutrophils Relative %: 71 %
PLATELETS: 153 10*3/uL (ref 150–400)
RBC: 3.8 MIL/uL — ABNORMAL LOW (ref 3.87–5.11)
RDW: 14.7 % (ref 11.5–15.5)
WBC: 13.7 10*3/uL — ABNORMAL HIGH (ref 4.0–10.5)

## 2015-11-27 MED ORDER — IBUPROFEN 600 MG PO TABS: 600.0000 mg | ORAL_TABLET | Freq: Four times a day (QID) | ORAL | Status: DC

## 2015-11-27 MED ORDER — FERROUS SULFATE 325 (65 FE) MG PO TBEC: 325.0000 mg | DELAYED_RELEASE_TABLET | Freq: Two times a day (BID) | ORAL | Status: DC

## 2015-11-27 NOTE — Lactation Note (Addendum)
This note was copied from the chart of Girl Taylor Strickland. Lactation Consultation Note Baby 35 hours old and hasn't has a stool yJerene Cannyet. Had 5 voids at 33 hours old. Hand expression to check colostrum flow. Lt. Breast colostrum flows easier than Rt. Rt. Colostrum thicker. Mom has general LE edema. Breast feels heavy. Rt. Feels heavier than left, but baby just finished BF on Lt. Nipples are semi flat, everts some w/nipple stimulation, compressible. Mom denies nipple pain or painful latches. Shells given to wear in bra to evert nipple. Hand pump given as well. Leg exercises to baby to stimulate gas or BM. Hand expressed 2ml colostrum and gave to baby w/gloved finger and curve tips syring. Baby moves tongue past gum line out of mouth. Has a strong suck. Mom has WIC. Encouraged to massage breast during BF. Encouraged to keep strict I&O. Explained to mom baby has to have a stool in order to go home. Reviewed guide lines on out put. [facility] and community resources. Patient Name: [name] Daily ZOXWR'UToday's Date: 11/27/2015 Reason for consult: Initial assessment   Maternal Data    Feeding Feeding Type: Breast Fed Length of feed: 30 min  LATCH Score/Interventions Latch: Repeated attempts needed to sustain latch, nipple held in mouth throughout feeding, stimulation needed to elicit sucking reflex. Intervention(s): Breast massage;Breast compression  Audible Swallowing: A few with stimulation Intervention(s): Hand expression  Type of Nipple: Everted at rest and after stimulation (semi flat) Intervention(s): Shells;Hand pump  Comfort (Breast/Nipple): Soft / non-tender     Hold (Positioning): No assistance needed to correctly position infant at breast. Intervention(s): Skin to skin;Position options;Support Pillows;Breastfeeding basics reviewed  LATCH Score: 8  Lactation Tools Discussed/Used Tools: Shells;Pump Shell Type: Inverted Breast pump type: Manual Pump Review: Setup,  frequency, and cleaning;Milk Storage Initiated by:: Peri JeffersonL. Waylen Depaolo RN Date initiated:: 11/27/15   Consult Status Consult Status: Follow-up Date: 11/27/15 Follow-up type: In-patient    Charyl DancerCARVER, Stpehen Petitjean G 11/27/2015, 11:15 AM

## 2015-11-27 NOTE — Discharge Summary (Signed)
OB Discharge Summary  Patient Name: Taylor Strickland DOB: 1991-03-07 MRN: [id number]  Date of admission: 11/25/2015 Delivering MD: Gerrit Heck   Date of discharge: 11/27/2015  Admitting diagnosis: 39WKS,LABOR Intrauterine pregnancy: [redacted]w[redacted]d     Secondary diagnosis:Principal Problem:   SVD (spontaneous vaginal delivery) Active Problems:   Normal labor   Obstetric vaginal laceration, delivered, current hospitalization   Obstetric labial laceration, delivered, current hospitalization   Active labor at term  Additional problems:asthma     Discharge diagnosis: Term Pregnancy Delivered                                                                     Post partum procedures:none  Augmentation: AROM  Complications: None  Hospital course:  Onset of Labor With Vaginal Delivery     24 y.o. yo G2P1011 at [redacted]w[redacted]d was admitted in Latent Laboron 11/25/2015. Patient had an uncomplicated labor course as follows:  Membrane Rupture Time/Date: 9:43 PM ,11/25/2015   Intrapartum Procedures: Episiotomy: None [1]                                         Lacerations:  Vaginal [6];Labial [10]  Patient had a delivery of a Viable infant. 11/25/2015  Information for the patient's newborn:  Emilya, Justen [[id number]]  Delivery Method: Vag-Spont    Pateint had an uncomplicated postpartum course.  She is ambulating, tolerating a regular diet, passing flatus, and urinating well. Patient is discharged home in stable condition on No discharge date for patient encounter.Marland Kitchen    Physical exam  Filed Vitals:   11/26/15 0644 11/26/15 1445 11/26/15 1825 11/27/15 0606  BP: 132/82 103/71 122/68 115/86  Pulse: 99 109 75 72  Temp: 99.1 F (37.3 C) 98.2 F (36.8 C) 98.3 F (36.8 C) 98.1 F (36.7 C)  TempSrc: Oral Oral Oral Oral  Resp: Height:      Weight:       General: alert and cooperative Lochia: appropriate Uterine Fundus: firm Incision: Healing well with no significant  drainage DVT Evaluation: No evidence of DVT seen on physical exam. Labs: Lab Results  Component Value Date   WBC 13.7* 11/27/2015   HGB 10.5* 11/27/2015   HCT 32.1* 11/27/2015   MCV 84.5 11/27/2015   PLT 153 11/27/2015   CMP Latest Ref Rng 11/25/2015  Glucose 65 - 99 mg/dL 99  BUN 6 - 20 mg/dL 10  Creatinine 7.82 - 9.56 mg/dL 2.13  Sodium 086 - 578 mmol/L 138  Potassium 3.5 - 5.1 mmol/L 4.2  Chloride 101 - 111 mmol/L 107  CO2 22 - 32 mmol/L 21(L)  Calcium 8.9 - 10.3 mg/dL 9.2  Total Protein 6.5 - 8.1 g/dL 6.3(L)  Total Bilirubin 0.3 - 1.2 mg/dL 1.0  Alkaline Phos 38 - 126 U/L 189(H)  AST 15 - 41 U/L 28  ALT 14 - 54 U/L 24    Discharge instruction: per After Visit Summary and "Baby and Me Booklet".  Medications:  Current facility-administered medications:  .  acetaminophen (TYLENOL) tablet 650 mg, 650 mg, Oral, Q4H PRN, Ezme Duch, CNM, 650 mg at 11/26/15 2002 .  acetaminophen (TYLENOL) tablet 650 mg, 650 mg, Oral, Q4H PRN, Gerrit Heck, CNM, 650 mg at 11/26/15 0641 .  albuterol (PROVENTIL) (2.5 MG/3ML) 0.083% nebulizer solution 3 mL, 3 mL, Inhalation, Q6H PRN, Shyheim Tanney, CNM .  benzocaine-Menthol (DERMOPLAST) 20-0.5 % topical spray 1 application, 1 application, Topical, PRN, Gerrit Heck, CNM, 1 application at 11/26/15 0252 .  citric acid-sodium citrate (ORACIT) solution 30 mL, 30 mL, Oral, Q2H PRN, Kohana Amble, CNM .  witch hazel-glycerin (TUCKS) pad 1 application, 1 application, Topical, PRN **AND** dibucaine (NUPERCAINAL) 1 % rectal ointment 1 application, 1 application, Rectal, PRN, Gerrit Heck, CNM .  diphenhydrAMINE (BENADRYL) capsule 25 mg, 25 mg, Oral, Q6H PRN, Gerrit Heck, CNM .  ibuprofen (ADVIL,MOTRIN) tablet 600 mg, 600 mg, Oral, 4 times per day, Gerrit Heck, CNM, 600 mg at 11/27/15 0532 .  lactated ringers infusion 500-1,000 mL, 500-1,000 mL, Intravenous, PRN, Leiliana Foody, CNM .  lactated ringers infusion, , Intravenous, Continuous, Bernadean Saling, CNM, Last Rate: 125 mL/hr at 11/25/15 2007 .  lanolin ointment, , Topical, PRN, Gerrit Heck, CNM .  lidocaine (PF) (XYLOCAINE) 1 % injection 30 mL, 30 mL, Subcutaneous, PRN, Victorina Kable, CNM, 30 mL at 11/25/15 2336 .  nalbuphine (NUBAIN) injection 5 mg, 5 mg, Intravenous, Q2H PRN, Bliss Behnke, CNM, 5 mg at 11/26/15 0010 .  ondansetron (ZOFRAN) injection 4 mg, 4 mg, Intravenous, Q6H PRN, Tenaya Hilyer, CNM .  ondansetron (ZOFRAN) tablet 4 mg, 4 mg, Oral, Q4H PRN **OR** ondansetron (ZOFRAN) injection 4 mg, 4 mg, Intravenous, Q4H PRN, Gerrit Heck, CNM .  oxyCODONE-acetaminophen (PERCOCET/ROXICET) 5-325 MG per tablet 1 tablet, 1 tablet, Oral, Q4H PRN, Wynee Matarazzo, CNM .  oxyCODONE-acetaminophen (PERCOCET/ROXICET) 5-325 MG per tablet 1 tablet, 1 tablet, Oral, Q4H PRN, Gerrit Heck, CNM .  oxyCODONE-acetaminophen (PERCOCET/ROXICET) 5-325 MG per tablet 2 tablet, 2 tablet, Oral, Q4H PRN, Keylen Uzelac, CNM .  oxyCODONE-acetaminophen (PERCOCET/ROXICET) 5-325 MG per tablet 2 tablet, 2 tablet, Oral, Q4H PRN, Gerrit Heck, CNM .  oxytocin (PITOCIN) IV BOLUS FROM BAG, 500 mL, Intravenous, Continuous, Shigeo Baugh, CNM .  oxytocin (PITOCIN) IV infusion 40 units in LR 1000 mL, 62.5 mL/hr, Intravenous, Continuous, Shoni Quijas, CNM, Last Rate: 999 mL/hr at 11/25/15 2337, 999 mL/hr at 11/25/15 2337 .  prenatal multivitamin tablet 1 tablet, 1 tablet, Oral, Q1200, Gerrit Heck, CNM, 1 tablet at 11/26/15 1245 .  senna-docusate (Senokot-S) tablet 2 tablet, 2 tablet, Oral, Q24H, Gerrit Heck, CNM, 2 tablet at 11/27/15 0020 .  simethicone (MYLICON) chewable tablet 80 mg, 80 mg, Oral, PRN, Gerrit Heck, CNM .  Tdap (BOOSTRIX) injection 0.5 mL, 0.5 mL, Intramuscular, Once, Gerrit Heck, CNM, 0.5 mL at 11/26/15 0516 .  zolpidem (AMBIEN) tablet 5 mg, 5 mg, Oral, QHS PRN, Gerrit Heck, CNM After Visit Meds:    Medication List    ASK your doctor about these medications        albuterol 108 (90  BASE) MCG/ACT inhaler  Commonly known as:  PROVENTIL HFA;VENTOLIN HFA  Inhale 1-2 puffs into the lungs every 6 (six) hours as needed for wheezing or shortness of breath.     cephALEXin 500 MG capsule  Commonly known as:  KEFLEX  Take 1 capsule (500 mg total) by mouth 2 (two) times daily.     fluticasone 50 MCG/ACT nasal spray  Commonly known as:  FLONASE  Place 2 sprays into both nostrils daily.     prednisoLONE acetate 1 % ophthalmic suspension  Commonly known as:  PRED FORTE  Place 1  drop into the left eye 4 (four) times daily.     Prenatal Vitamins 28-0.8 MG Tabs  Take 1 tablet by mouth daily.     traMADol 50 MG tablet  Commonly known as:  ULTRAM  1 to 2 every 8 hours as needed for cough.        Diet: routine diet  Activity: Advance as tolerated. Pelvic rest for 6 weeks.   Outpatient follow up:6 weeks Follow up Appt:No future appointments. Follow up visit: No Follow-up on file.  Postpartum contraception: Undecided   Newborn Data: Live born female  Birth Weight: 7 lb 9 oz (3430 g) APGAR: 6, 9  Baby Feeding: Breast Disposition:home with mother   11/27/2015 Bailen Geffre, CNM      Postpartum Care After Vaginal Delivery  After you deliver your newborn (postpartum period), the usual stay in the hospital is 24 72 hours. If there were problems with your labor or delivery, or if you have other medical problems, you might be in the hospital longer.  While you are in the hospital, you will receive help and instructions on how to care for yourself and your newborn during the postpartum period.  While you are in the hospital:  Be sure to tell your nurses if you have pain or discomfort, as well as where you feel the pain and what makes the pain worse.  If you had an incision made near your vagina (episiotomy) or if you had some tearing during delivery, the nurses may put ice packs on your episiotomy or tear. The ice packs may help to reduce the pain and  swelling.  If you are breastfeeding, you may feel uncomfortable contractions of your uterus for a couple of weeks. This is normal. The contractions help your uterus get back to normal size.  It is normal to have some bleeding after delivery.  For the first 1 3 days after delivery, the flow is red and the amount may be similar to a period.  It is common for the flow to start and stop.  In the first few days, you may pass some small clots. Let your nurses know if you begin to pass large clots or your flow increases.  Do not  flush blood clots down the toilet before having the nurse look at them.  During the next 3 10 days after delivery, your flow should become more watery and pink or brown-tinged in color.  Ten to fourteen days after delivery, your flow should be a small amount of yellowish-white discharge.  The amount of your flow will decrease over the first few weeks after delivery. Your flow may stop in 6 8 weeks. Most women have had their flow stop by 12 weeks after delivery.  You should change your sanitary pads frequently.  Wash your hands thoroughly with soap and water for at least 20 seconds after changing pads, using the toilet, or before holding or feeding your newborn.  You should feel like you need to empty your bladder within the first 6 8 hours after delivery.  In case you become weak, lightheaded, or faint, call your nurse before you get out of bed for the first time and before you take a shower for the first time.  Within the first few days after delivery, your breasts may begin to feel tender and full. This is called engorgement. Breast tenderness usually goes away within 48 72 hours after engorgement occurs. You may also notice milk leaking from your breasts. If you are not  breastfeeding, do not stimulate your breasts. Breast stimulation can make your breasts produce more milk.  Spending as much time as possible with your newborn is very important. During this time,  you and your newborn can feel close and get to know each other. Having your newborn stay in your room (rooming in) will help to strengthen the bond with your newborn. It will give you time to get to know your newborn and become comfortable caring for your newborn.  Your hormones change after delivery. Sometimes the hormone changes can temporarily cause you to feel sad or tearful. These feelings should not last more than a few days. If these feelings last longer than that, you should talk to your caregiver.  If desired, talk to your caregiver about methods of family planning or contraception.  Talk to your caregiver about immunizations. Your caregiver may want you to have the following immunizations before leaving the hospital:  Tetanus, diphtheria, and pertussis (Tdap) or tetanus and diphtheria (Td) immunization. It is very important that you and your family (including grandparents) or others caring for your newborn are up-to-date with the Tdap or Td immunizations. The Tdap or Td immunization can help protect your newborn from getting ill.  Rubella immunization.  Varicella (chickenpox) immunization.  Influenza immunization. You should receive this annual immunization if you did not receive the immunization during your pregnancy. Document Released: 09/16/2007 Document Revised: 08/13/2012 Document Reviewed: 07/16/2012 [facility] Patient Information 2014 [employer] [place].   Postpartum Depression and Baby Blues  The postpartum period begins right after the birth of a baby. During this time, there is often a great amount of joy and excitement. It is also a time of considerable changes in the life of the parent(s). Regardless of how many times a mother gives birth, each child brings new challenges and dynamics to the family. It is not unusual to have feelings of excitement accompanied by confusing shifts in moods, emotions, and thoughts. All mothers are at risk of developing postpartum depression or the  "baby blues." These mood changes can occur right after giving birth, or they may occur many months after giving birth. The baby blues or postpartum depression can be mild or severe. Additionally, postpartum depression can resolve rather quickly, or it can be a long-term condition. CAUSES Elevated hormones and their rapid decline are thought to be a main cause of postpartum depression and the baby blues. There are a number of hormones that radically change during and after pregnancy. Estrogen and progesterone usually decrease immediately after delivering your baby. The level of thyroid hormone and various cortisol steroids also rapidly drop. Other factors that play a major role in these changes include major life events and genetics.  RISK FACTORS If you have any of the following risks for the baby blues or postpartum depression, know what symptoms to watch out for during the postpartum period. Risk factors that may increase the likelihood of getting the baby blues or postpartum depression include: 1. Havinga personal or family history of depression. 2. Having depression while being pregnant. 3. Having premenstrual or oral contraceptive-associated mood issues. 4. Having exceptional life stress. 5. Having marital conflict. 6. Lacking a social support network. 7. Having a baby with special needs. 8. Having health problems such as diabetes. SYMPTOMS Baby blues symptoms include:  Brief fluctuations in mood, such as going from extreme happiness to sadness.  Decreased concentration.  Difficulty sleeping.  Crying spells, tearfulness.  Irritability.  Anxiety. Postpartum depression symptoms typically begin within the first month after giving  birth. These symptoms include:  Difficulty sleeping or excessive sleepiness.  Marked weight loss.  Agitation.  Feelings of worthlessness.  Lack of interest in activity or food. Postpartum psychosis is a very concerning condition and can be dangerous.  Fortunately, it is rare. Displaying any of the following symptoms is cause for immediate medical attention. Postpartum psychosis symptoms include:  Hallucinations and delusions.  Bizarre or disorganized behavior.  Confusion or disorientation. DIAGNOSIS  A diagnosis is made by an evaluation of your symptoms. There are no medical or lab tests that lead to a diagnosis, but there are various questionnaires that a caregiver may use to identify those with the baby blues, postpartum depression, or psychosis. Often times, a screening tool called the [place] Postnatal Depression Scale is used to diagnose depression in the postpartum period.  TREATMENT The baby blues usually goes away on its own in 1 to 2 weeks. Social support is often all that is needed. You should be encouraged to get adequate sleep and rest. Occasionally, you may be given medicines to help you sleep.  Postpartum depression requires treatment as it can last several months or longer if it is not treated. Treatment may include individual or group therapy, medicine, or both to address any social, physiological, and psychological factors that may play a role in the depression. Regular exercise, a healthy diet, rest, and social support may also be strongly recommended.  Postpartum psychosis is more serious and needs treatment right away. Hospitalization is often needed. HOME CARE INSTRUCTIONS  Get as much rest as you can. Nap when the baby sleeps.  Exercise regularly. Some women find yoga and walking to be beneficial.  Eat a balanced and nourishing diet.  Do little things that you enjoy. Have a cup of tea, take a bubble bath, read your favorite magazine, or listen to your favorite music.  Avoid alcohol.  Ask for help with household chores, cooking, grocery shopping, or running errands as needed. Do not try to do everything.  Talk to people close to you about how you are feeling. Get support from your partner, family members, friends,  or other new moms.  Try to stay positive in how you think. Think about the things you are grateful for.  Do not spend a lot of time alone.  Only take medicine as directed by your caregiver.  Keep all your postpartum appointments.  Let your caregiver know if you have any concerns. SEEK MEDICAL CARE IF: You are having a reaction or problems with your medicine. SEEK IMMEDIATE MEDICAL CARE IF:  You have suicidal feelings.  You feel you may harm the baby or someone else. Document Released: 08/23/2004 Document Revised: 02/11/2012 Document Reviewed: 09/25/2011 [facility] Patient Information 2014 [place], [place].     Breastfeeding Deciding to breastfeed is one of the best choices you can make for you and your baby. A change in hormones during pregnancy causes your breast tissue to grow and increases the number and size of your milk ducts. These hormones also allow proteins, sugars, and fats from your blood supply to make breast milk in your milk-producing glands. Hormones prevent breast milk from being released before your baby is born as well as prompt milk flow after birth. Once breastfeeding has begun, thoughts of your baby, as well as his or her sucking or crying, can stimulate the release of milk from your milk-producing glands.  BENEFITS OF BREASTFEEDING For Your Baby  Your first milk (colostrum) helps your baby's digestive system function better.   There are  antibodies in your milk that help your baby fight off infections.   Your baby has a lower incidence of asthma, allergies, and sudden infant death syndrome.   The nutrients in breast milk are better for your baby than infant formulas and are designed uniquely for your baby's needs.   Breast milk improves your baby's brain development.   Your baby is less likely to develop other conditions, such as childhood obesity, asthma, or type 2 diabetes mellitus.  For You   Breastfeeding helps to create a very special bond  between you and your baby.   Breastfeeding is convenient. Breast milk is always available at the correct temperature and costs nothing.   Breastfeeding helps to burn calories and helps you lose the weight gained during pregnancy.   Breastfeeding makes your uterus contract to its prepregnancy size faster and slows bleeding (lochia) after you give birth.   Breastfeeding helps to lower your risk of developing type 2 diabetes mellitus, osteoporosis, and breast or ovarian cancer later in life. SIGNS THAT YOUR BABY IS HUNGRY Early Signs of Hunger  Increased alertness or activity.  Stretching.  Movement of the head from side to side.  Movement of the head and opening of the mouth when the corner of the mouth or cheek is stroked (rooting).  Increased sucking sounds, smacking lips, cooing, sighing, or squeaking.  Hand-to-mouth movements.  Increased sucking of fingers or hands. Late Signs of Hunger  Fussing.  Intermittent crying. Extreme Signs of Hunger Signs of extreme hunger will require calming and consoling before your baby will be able to breastfeed successfully. Do not wait for the following signs of extreme hunger to occur before you initiate breastfeeding:   Restlessness.  A loud, strong cry.   Screaming.   BREASTFEEDING BASICS Breastfeeding Initiation  Find a comfortable place to sit or lie down, with your neck and back well supported.  Place a pillow or rolled up blanket under your baby to bring him or her to the level of your breast (if you are seated). Nursing pillows are specially designed to help support your arms and your baby while you breastfeed.  Make sure that your baby's abdomen is facing your abdomen.   Gently massage your breast. With your fingertips, massage from your chest wall toward your nipple in a circular motion. This encourages milk flow. You may need to continue this action during the feeding if your milk flows slowly.  Support your  breast with 4 fingers underneath and your thumb above your nipple. Make sure your fingers are well away from your nipple and your baby's mouth.   Stroke your baby's lips gently with your finger or nipple.   When your baby's mouth is open wide enough, quickly bring your baby to your breast, placing your entire nipple and as much of the colored area around your nipple (areola) as possible into your baby's mouth.   More areola should be visible above your baby's upper lip than below the lower lip.   Your baby's tongue should be between his or her lower gum and your breast.   Ensure that your baby's mouth is correctly positioned around your nipple (latched). Your baby's lips should create a seal on your breast and be turned out (everted).  It is common for your baby to suck about 2-3 minutes in order to start the flow of breast milk. Latching Teaching your baby how to latch on to your breast properly is very important. An improper latch can cause nipple pain  and decreased milk supply for you and poor weight gain in your baby. Also, if your baby is not latched onto your nipple properly, he or she may swallow some air during feeding. This can make your baby fussy. Burping your baby when you switch breasts during the feeding can help to get rid of the air. However, teaching your baby to latch on properly is still the best way to prevent fussiness from swallowing air while breastfeeding. Signs that your baby has successfully latched on to your nipple:    Silent tugging or silent sucking, without causing you pain.   Swallowing heard between every 3-4 sucks.    Muscle movement above and in front of his or her ears while sucking.  Signs that your baby has not successfully latched on to nipple:   Sucking sounds or smacking sounds from your baby while breastfeeding.  Nipple pain. If you think your baby has not latched on correctly, slip your finger into the corner of your baby's mouth to break  the suction and place it between your baby's gums. Attempt breastfeeding initiation again. Signs of Successful Breastfeeding Signs from your baby:   A gradual decrease in the number of sucks or complete cessation of sucking.   Falling asleep.   Relaxation of his or her body.   Retention of a small amount of milk in his or her mouth.   Letting go of your breast by himself or herself. Signs from you:  Breasts that have increased in firmness, weight, and size 1-3 hours after feeding.   Breasts that are softer immediately after breastfeeding.  Increased milk volume, as well as a change in milk consistency and color by the fifth day of breastfeeding.   Nipples that are not sore, cracked, or bleeding. Signs That Your Pecola Leisure is Getting Enough Milk  Wetting at least 3 diapers in a 24-hour period. The urine should be clear and pale yellow by age 10 days.  At least 3 stools in a 24-hour period by age 10 days. The stool should be soft and yellow.  At least 3 stools in a 24-hour period by age 16 days. The stool should be seedy and yellow.  No loss of weight greater than 10% of birth weight during the first 37 days of age.  Average weight gain of 4-7 ounces (113-198 g) per week after age 60 days.  Consistent daily weight gain by age 10 days, without weight loss after the age of 2 weeks. After a feeding, your baby may spit up a small amount. This is common. BREASTFEEDING FREQUENCY AND DURATION Frequent feeding will help you make more milk and can prevent sore nipples and breast engorgement. Breastfeed when you feel the need to reduce the fullness of your breasts or when your baby shows signs of hunger. This is called "breastfeeding on demand." Avoid introducing a pacifier to your baby while you are working to establish breastfeeding (the first 4-6 weeks after your baby is born). After this time you may choose to use a pacifier. Research has shown that pacifier use during the first year of a  baby's life decreases the risk of sudden infant death syndrome (SIDS). Allow your baby to feed on each breast as long as he or she wants. Breastfeed until your baby is finished feeding. When your baby unlatches or falls asleep while feeding from the first breast, offer the second breast. Because newborns are often sleepy in the first few weeks of life, you may need to awaken your  baby to get him or her to feed. Breastfeeding times will vary from baby to baby. However, the following rules can serve as a guide to help you ensure that your baby is properly fed:  Newborns (babies 71 weeks of age or younger) may breastfeed every 1-3 hours.  Newborns should not go longer than 3 hours during the day or 5 hours during the night without breastfeeding.  You should breastfeed your baby a minimum of 8 times in a 24-hour period until you begin to introduce solid foods to your baby at around 36 months of age. BREAST MILK PUMPING Pumping and storing breast milk allows you to ensure that your baby is exclusively fed your breast milk, even at times when you are unable to breastfeed. This is especially important if you are going back to work while you are still breastfeeding or when you are not able to be present during feedings. Your lactation consultant can give you guidelines on how long it is safe to store breast milk.  A breast pump is a machine that allows you to pump milk from your breast into a sterile bottle. The pumped breast milk can then be stored in a refrigerator or freezer. Some breast pumps are operated by hand, while others use electricity. Ask your lactation consultant which type will work best for you. Breast pumps can be purchased, but some hospitals and breastfeeding support groups lease breast pumps on a monthly basis. A lactation consultant can teach you how to hand express breast milk, if you prefer not to use a pump.  CARING FOR YOUR BREASTS WHILE YOU BREASTFEED Nipples can become dry, cracked, and  sore while breastfeeding. The following recommendations can help keep your breasts moisturized and healthy:  Avoid using soap on your nipples.   Wear a supportive bra. Although not required, special nursing bras and tank tops are designed to allow access to your breasts for breastfeeding without taking off your entire bra or top. Avoid wearing underwire-style bras or extremely tight bras.  Air dry your nipples for 3-73minutes after each feeding.   Use only cotton bra pads to absorb leaked breast milk. Leaking of breast milk between feedings is normal.   Use lanolin on your nipples after breastfeeding. Lanolin helps to maintain your skin's normal moisture barrier. If you use pure lanolin, you do not need to wash it off before feeding your baby again. Pure lanolin is not toxic to your baby. You may also hand express a few drops of breast milk and gently massage that milk into your nipples and allow the milk to air dry. In the first few weeks after giving birth, some women experience extremely full breasts (engorgement). Engorgement can make your breasts feel heavy, warm, and tender to the touch. Engorgement peaks within 3-5 days after you give birth. The following recommendations can help ease engorgement:  Completely empty your breasts while breastfeeding or pumping. You may want to start by applying warm, moist heat (in the shower or with warm water-soaked hand towels) just before feeding or pumping. This increases circulation and helps the milk flow. If your baby does not completely empty your breasts while breastfeeding, pump any extra milk after he or she is finished.  Wear a snug bra (nursing or regular) or tank top for 1-2 days to signal your body to slightly decrease milk production.  Apply ice packs to your breasts, unless this is too uncomfortable for you.  Make sure that your baby is latched on and positioned properly  while breastfeeding. If engorgement persists after 48 hours of  following these recommendations, contact your health care provider or a Advertising copywriter. OVERALL HEALTH CARE RECOMMENDATIONS WHILE BREASTFEEDING  Eat healthy foods. Alternate between meals and snacks, eating 3 of each per day. Because what you eat affects your breast milk, some of the foods may make your baby more irritable than usual. Avoid eating these foods if you are sure that they are negatively affecting your baby.  Drink milk, fruit juice, and water to satisfy your thirst (about 10 glasses a day).   Rest often, relax, and continue to take your prenatal vitamins to prevent fatigue, stress, and anemia.  Continue breast self-awareness checks.  Avoid chewing and smoking tobacco.  Avoid alcohol and drug use. Some medicines that may be harmful to your baby can pass through breast milk. It is important to ask your health care provider before taking any medicine, including all over-the-counter and prescription medicine as well as vitamin and herbal supplements. It is possible to become pregnant while breastfeeding. If birth control is desired, ask your health care provider about options that will be safe for your baby. SEEK MEDICAL CARE IF:   You feel like you want to stop breastfeeding or have become frustrated with breastfeeding.  You have painful breasts or nipples.  Your nipples are cracked or bleeding.  Your breasts are red, tender, or warm.  You have a swollen area on either breast.  You have a fever or chills.  You have nausea or vomiting.  You have drainage other than breast milk from your nipples.  Your breasts do not become full before feedings by the fifth day after you give birth.  You feel sad and depressed.  Your baby is too sleepy to eat well.  Your baby is having trouble sleeping.   Your baby is wetting less than 3 diapers in a 24-hour period.  Your baby has less than 3 stools in a 24-hour period.  Your baby's skin or the white part of his or her  eyes becomes yellow.   Your baby is not gaining weight by 52 days of age. SEEK IMMEDIATE MEDICAL CARE IF:   Your baby is overly tired (lethargic) and does not want to wake up and feed.  Your baby develops an unexplained fever. Document Released: 11/19/2005 Document Revised: 11/24/2013 Document Reviewed: 05/13/2013 [facility] Patient Information 2015 [place], [place]. This information is not intended to replace advice given to you by your health care provider. Make sure you discuss any questions you have with your health care provider.

## 2020-02-05 ENCOUNTER — Encounter: Payer: Self-pay | Admitting: Obstetrics and Gynecology

## 2020-03-17 ENCOUNTER — Other Ambulatory Visit: Payer: Self-pay | Admitting: Obstetrics and Gynecology

## 2020-03-17 DIAGNOSIS — N926 Irregular menstruation, unspecified: Secondary | ICD-10-CM

## 2020-03-21 ENCOUNTER — Ambulatory Visit
Admission: RE | Admit: 2020-03-21 | Discharge: 2020-03-21 | Disposition: A | Discharge status: Home / Self Care | Payer: Medicaid Other | Source: Ambulatory Visit | Attending: Obstetrics and Gynecology | Admitting: Obstetrics and Gynecology

## 2020-03-21 ENCOUNTER — Other Ambulatory Visit: Payer: Self-pay

## 2020-03-21 DIAGNOSIS — N926 Irregular menstruation, unspecified: Secondary | ICD-10-CM

## 2020-04-05 ENCOUNTER — Other Ambulatory Visit: Payer: Self-pay | Admitting: Obstetrics and Gynecology

## 2020-04-05 DIAGNOSIS — N926 Irregular menstruation, unspecified: Secondary | ICD-10-CM

## 2020-08-20 ENCOUNTER — Encounter (HOSPITAL_COMMUNITY): Payer: Self-pay | Admitting: *Deleted

## 2020-08-20 ENCOUNTER — Ambulatory Visit (HOSPITAL_COMMUNITY)
Admission: EM | Admit: 2020-08-20 | Discharge: 2020-08-20 | Disposition: A | Discharge status: Home / Self Care | Payer: Medicaid Other | Attending: Family Medicine | Admitting: Family Medicine

## 2020-08-20 ENCOUNTER — Other Ambulatory Visit: Payer: Self-pay

## 2020-08-20 DIAGNOSIS — R1030 Lower abdominal pain, unspecified: Secondary | ICD-10-CM | POA: Diagnosis not present

## 2020-08-20 DIAGNOSIS — N898 Other specified noninflammatory disorders of vagina: Secondary | ICD-10-CM | POA: Insufficient documentation

## 2020-08-20 DIAGNOSIS — Z3202 Encounter for pregnancy test, result negative: Secondary | ICD-10-CM

## 2020-08-20 LAB — POCT URINALYSIS DIPSTICK, ED / UC
Bilirubin Urine: NEGATIVE
Glucose, UA: NEGATIVE mg/dL
Hgb urine dipstick: NEGATIVE
Ketones, ur: NEGATIVE mg/dL
Leukocytes,Ua: NEGATIVE
Nitrite: NEGATIVE
Protein, ur: NEGATIVE mg/dL
Specific Gravity, Urine: 1.02 (ref 1.005–1.030)
Urobilinogen, UA: 0.2 mg/dL (ref 0.0–1.0)
pH: 8.5 — ABNORMAL HIGH (ref 5.0–8.0)

## 2020-08-20 LAB — POC URINE PREG, ED: Preg Test, Ur: NEGATIVE

## 2020-08-20 MED ORDER — NAPROXEN 500 MG PO TABS: 500.0000 mg | ORAL_TABLET | Freq: Two times a day (BID) | ORAL | 0 refills | Status: DC

## 2020-08-20 MED ORDER — CEFTRIAXONE SODIUM 500 MG IJ SOLR
500.0000 mg | Freq: Once | INTRAMUSCULAR | Status: AC
  Administered 2020-08-20: 500 mg via INTRAMUSCULAR

## 2020-08-20 MED ORDER — CEFTRIAXONE SODIUM 500 MG IJ SOLR
INTRAMUSCULAR | Status: AC
  Filled 2020-08-20: qty 500

## 2020-08-20 MED ORDER — DOXYCYCLINE HYCLATE 100 MG PO CAPS: 100.0000 mg | ORAL_CAPSULE | Freq: Two times a day (BID) | ORAL | 0 refills | Status: AC

## 2020-08-20 MED ORDER — LIDOCAINE HCL 2 % IJ SOLN
INTRAMUSCULAR | Status: AC
  Filled 2020-08-20: qty 20

## 2020-08-20 NOTE — ED Provider Notes (Signed)
[facility]    CSN: [id number] Arrival date & time: 08/20/20  1458      History   Chief Complaint Chief Complaint  Patient presents with   Abdominal Pain    HPI Taylor Strickland is a 29 y.o. female presenting today for evaluation of abdominal pain and vaginal discharge.  Symptoms began approximately 2 days ago.  She recently found out her partner tested positive for gonorrhea.  She has had cramping and sharp pain which is waxing and waning.  She denies any nausea or vomiting.  Has had some slight diarrhea.  She denies any associated itching or irritation.  Denies dysuria, but has had urinary frequency.  Eating and drinking normally without affecting pain.  Denies any abnormal bleeding.  Last menstrual cycle earlier this month, is not on birth control.   HPI  Past Medical History:  Diagnosis Date   Asthma     Patient Active Problem List   Diagnosis Date Noted   SVD (spontaneous vaginal delivery) 11/26/2015   Obstetric vaginal laceration, delivered, current hospitalization 11/26/2015   Obstetric labial laceration, delivered, current hospitalization 11/26/2015   Active labor at term 11/26/2015   Normal labor 11/25/2015    Past Surgical History:  Procedure Laterality Date   WISDOM TOOTH EXTRACTION Bilateral 2015    OB History    Gravida  2   Para  1   Term  1   Preterm      AB  1   Living  1     SAB  1   TAB      Ectopic      Multiple  0   Live Births  1            Home Medications    Prior to Admission medications   Medication Sig Start Date End Date Taking? Authorizing Provider  Multiple Vitamin (MULTIVITAMIN) capsule Take 1 capsule by mouth daily.   Yes [provider]  albuterol (PROVENTIL HFA;VENTOLIN HFA) 108 (90 BASE) MCG/ACT inhaler Inhale 1-2 puffs into the lungs every 6 (six) hours as needed for wheezing or shortness of breath.    [provider]  doxycycline (VIBRAMYCIN) 100 MG capsule Take 1  capsule (100 mg total) by mouth 2 (two) times daily for 7 days. 08/20/20 08/27/20  Qaadir Kent C, PA-C  ferrous sulfate 325 (65 FE) MG EC tablet Take 1 tablet (325 mg total) by mouth 2 (two) times daily. 11/27/15 11/26/16  Standard, Venus, CNM  ibuprofen (ADVIL,MOTRIN) 600 MG tablet Take 1 tablet (600 mg total) by mouth every 6 (six) hours. 11/27/15   Standard, Venus, CNM  naproxen (NAPROSYN) 500 MG tablet Take 1 tablet (500 mg total) by mouth 2 (two) times daily. 08/20/20   Shaniqua Guillot C, PA-C  Prenatal Vit-Fe Fumarate-FA (PRENATAL VITAMINS) 28-0.8 MG TABS Take 1 tablet by mouth daily. 03/30/15   Rasch, Harolyn Rutherford, NP    Family History Family History  Problem Relation Age of Onset   Heart disease Mother    Diabetes Maternal Grandmother     Social History Social History   Tobacco Use   Smoking status: Never Smoker   Smokeless tobacco: Never Used  Building services engineer Use: Never used  Substance Use Topics   Alcohol use: Yes    Comment: occasionally   Drug use: No     Allergies   Peanuts [peanut oil] and Shellfish allergy   Review of Systems Review of Systems  Constitutional: Negative for fever.  Respiratory: Negative for shortness of breath.   Cardiovascular: Negative for chest pain.  Gastrointestinal: Positive for abdominal pain. Negative for diarrhea, nausea and vomiting.  Genitourinary: Positive for frequency and vaginal discharge. Negative for dysuria, flank pain, genital sores, hematuria, menstrual problem, vaginal bleeding and vaginal pain.  Musculoskeletal: Negative for back pain.  Skin: Negative for rash.  Neurological: Negative for dizziness, light-headedness and headaches.     Physical Exam Triage Vital Signs ED Triage Vitals  Enc Vitals Group     BP 08/20/20 1730 126/79     Pulse Rate 08/20/20 1730 77     Resp 08/20/20 1730 16     Temp 08/20/20 1730 98.1 F (36.7 C)     Temp Source 08/20/20 1730 Oral     SpO2 08/20/20 1730 100 %     Weight  --      Height --      Head Circumference --      Peak Flow --      Pain Score 08/20/20 1731 4     Pain Loc --      Pain Edu? --      Excl. in GC? --    No data found.  Updated Vital Signs BP 126/79    Pulse 77    Temp 98.1 F (36.7 C) (Oral)    Resp 16    LMP 08/03/2020 (Exact Date)    SpO2 100%    Breastfeeding No   Visual Acuity Right Eye Distance:   Left Eye Distance:   Bilateral Distance:    Right Eye Near:   Left Eye Near:    Bilateral Near:     Physical Exam Vitals and nursing note reviewed.  Constitutional:      Appearance: She is well-developed.     Comments: No acute distress  HENT:     Head: Normocephalic and atraumatic.     Nose: Nose normal.  Eyes:     Conjunctiva/sclera: Conjunctivae normal.  Cardiovascular:     Rate and Rhythm: Normal rate.  Pulmonary:     Effort: Pulmonary effort is normal. No respiratory distress.     Comments: Breathing comfortably at rest, CTABL, no wheezing, rales or other adventitious sounds auscultated Abdominal:     General: There is no distension.     Comments: Abdomen soft, nondistended, tender to palpation to bilateral lower quadrants, no focal tenderness, negative rebound, negative Rovsing, negative McBurney's  Musculoskeletal:        General: Normal range of motion.     Cervical back: Neck supple.  Skin:    General: Skin is warm and dry.  Neurological:     Mental Status: She is alert and oriented to person, place, and time.      UC Treatments / Results  Labs (all labs ordered are listed, but only abnormal results are displayed) Labs Reviewed  POCT URINALYSIS DIPSTICK, ED / UC - Abnormal; Notable for the following components:      Result Value   pH 8.5 (*)    All other components within normal limits  POC URINE PREG, ED  CERVICOVAGINAL ANCILLARY ONLY    EKG   Radiology No results found.  Procedures Procedures (including critical care time)  Medications Ordered in UC Medications  cefTRIAXone  (ROCEPHIN) injection 500 mg (500 mg Intramuscular Given 08/20/20 1817)    Initial Impression / Assessment and Plan / UC Course  I have reviewed the triage vital signs and the nursing notes.  Pertinent labs & imaging results that  were available during my care of the patient were reviewed by me and considered in my medical decision making (see chart for details).     Abdominal pain and vaginal discharge after gonorrhea exposure.  Empirically treating for this today with Rocephin and doxycycline x1 week.  Vaginal swab pending.  UA negative for leuks and nitrites, negative pregnancy test.  Discussed strict return precautions. Patient verbalized understanding and is agreeable with plan.  Final Clinical Impressions(s) / UC Diagnoses   Final diagnoses:  Lower abdominal pain  Vaginal discharge     Discharge Instructions     We have treated you today for gonorrhea, with rocephin. Continue with doxycycline twice daily for 1 week for chlamydia. Please refrain from sexual activity for 7 days while medicine is clearing infection.  Naprosyn or Ibuprofen as needed for pain  We are testing you for Gonorrhea, Chlamydia and Trichomonas. We will call you if anything is positive and let you know if you require any further treatment. Please inform partner of any positive results.  Please return if symptoms not improving with treatment, development of fever, nausea, vomiting, abdominal pain, scrotal pain.    ED Prescriptions    Medication Sig Dispense Auth. Provider   doxycycline (VIBRAMYCIN) 100 MG capsule Take 1 capsule (100 mg total) by mouth 2 (two) times daily for 7 days. 14 capsule Zeeshan Korte C, PA-C   naproxen (NAPROSYN) 500 MG tablet Take 1 tablet (500 mg total) by mouth 2 (two) times daily. 30 tablet Tyshia Fenter, [place] C, PA-C     PDMP not reviewed this encounter.   Lew Dawes, PA-C 08/21/20 1218

## 2020-08-20 NOTE — ED Triage Notes (Signed)
C/O constant low abd pain x 2 days with vaginal discharge.  Denies fevers.

## 2020-08-20 NOTE — Discharge Instructions (Signed)
We have treated you today for gonorrhea, with rocephin. Continue with doxycycline twice daily for 1 week for chlamydia. Please refrain from sexual activity for 7 days while medicine is clearing infection.  Naprosyn or Ibuprofen as needed for pain  We are testing you for Gonorrhea, Chlamydia and Trichomonas. We will call you if anything is positive and let you know if you require any further treatment. Please inform partner of any positive results.  Please return if symptoms not improving with treatment, development of fever, nausea, vomiting, abdominal pain, scrotal pain.

## 2020-08-22 LAB — CERVICOVAGINAL ANCILLARY ONLY
Bacterial Vaginitis (gardnerella): POSITIVE — AB
Candida Glabrata: NEGATIVE
Candida Vaginitis: NEGATIVE
Chlamydia: NEGATIVE
Comment: NEGATIVE
Comment: NEGATIVE
Comment: NEGATIVE
Comment: NEGATIVE
Comment: NEGATIVE
Comment: NORMAL
Neisseria Gonorrhea: POSITIVE — AB
Trichomonas: NEGATIVE

## 2020-09-21 ENCOUNTER — Ambulatory Visit (HOSPITAL_COMMUNITY)
Admission: EM | Admit: 2020-09-21 | Discharge: 2020-09-21 | Disposition: A | Discharge status: Home / Self Care | Payer: Medicaid Other | Attending: Internal Medicine | Admitting: Internal Medicine

## 2020-09-21 ENCOUNTER — Encounter (HOSPITAL_COMMUNITY): Payer: Self-pay | Admitting: Emergency Medicine

## 2020-09-21 ENCOUNTER — Other Ambulatory Visit: Payer: Self-pay

## 2020-09-21 DIAGNOSIS — R350 Frequency of micturition: Secondary | ICD-10-CM | POA: Diagnosis not present

## 2020-09-21 DIAGNOSIS — R102 Pelvic and perineal pain: Secondary | ICD-10-CM | POA: Diagnosis not present

## 2020-09-21 DIAGNOSIS — Z202 Contact with and (suspected) exposure to infections with a predominantly sexual mode of transmission: Secondary | ICD-10-CM | POA: Insufficient documentation

## 2020-09-21 DIAGNOSIS — N898 Other specified noninflammatory disorders of vagina: Secondary | ICD-10-CM | POA: Diagnosis not present

## 2020-09-21 DIAGNOSIS — Z20822 Contact with and (suspected) exposure to covid-19: Secondary | ICD-10-CM | POA: Insufficient documentation

## 2020-09-21 DIAGNOSIS — R112 Nausea with vomiting, unspecified: Secondary | ICD-10-CM | POA: Diagnosis not present

## 2020-09-21 DIAGNOSIS — Z3202 Encounter for pregnancy test, result negative: Secondary | ICD-10-CM | POA: Diagnosis not present

## 2020-09-21 DIAGNOSIS — R1110 Vomiting, unspecified: Secondary | ICD-10-CM

## 2020-09-21 DIAGNOSIS — Z711 Person with feared health complaint in whom no diagnosis is made: Secondary | ICD-10-CM

## 2020-09-21 LAB — POCT URINALYSIS DIPSTICK, ED / UC
Bilirubin Urine: NEGATIVE
Glucose, UA: NEGATIVE mg/dL
Hgb urine dipstick: NEGATIVE
Ketones, ur: NEGATIVE mg/dL
Leukocytes,Ua: NEGATIVE
Nitrite: NEGATIVE
Protein, ur: NEGATIVE mg/dL
Specific Gravity, Urine: >=1.03 (ref 1.005–1.030)
Urobilinogen, UA: 0.2 mg/dL (ref 0.0–1.0)
pH: 6 (ref 5.0–8.0)

## 2020-09-21 LAB — SARS CORONAVIRUS 2 (TAT 6-24 HRS): SARS Coronavirus 2: NEGATIVE

## 2020-09-21 LAB — POC URINE PREG, ED: Preg Test, Ur: NEGATIVE

## 2020-09-21 MED ORDER — ONDANSETRON 4 MG PO TBDP
4.0000 mg | ORAL_TABLET | Freq: Once | ORAL | Status: AC
  Administered 2020-09-21: 4 mg via ORAL

## 2020-09-21 MED ORDER — LIDOCAINE HCL (PF) 1 % IJ SOLN
INTRAMUSCULAR | Status: AC
  Filled 2020-09-21: qty 2

## 2020-09-21 MED ORDER — ONDANSETRON 4 MG PO TBDP: 4.0000 mg | ORAL_TABLET | Freq: Three times a day (TID) | ORAL | 0 refills | Status: DC | PRN

## 2020-09-21 MED ORDER — DOXYCYCLINE HYCLATE 100 MG PO CAPS: 100.0000 mg | ORAL_CAPSULE | Freq: Two times a day (BID) | ORAL | 0 refills | Status: DC

## 2020-09-21 MED ORDER — CEFTRIAXONE SODIUM 500 MG IJ SOLR
500.0000 mg | Freq: Once | INTRAMUSCULAR | Status: AC
  Administered 2020-09-21: 500 mg via INTRAMUSCULAR

## 2020-09-21 MED ORDER — ONDANSETRON 4 MG PO TBDP
ORAL_TABLET | ORAL | Status: AC
  Filled 2020-09-21: qty 1

## 2020-09-21 MED ORDER — CEFTRIAXONE SODIUM 500 MG IJ SOLR
INTRAMUSCULAR | Status: AC
  Filled 2020-09-21: qty 500

## 2020-09-21 NOTE — ED Triage Notes (Addendum)
patient started vomiting 2 days ago.  One episode of vomiting today.  No diarrhea.  Complains of lower abdominal pain.  No diarrhea.  Vaginal discharge and increase in urination  Requesting sti testing

## 2020-09-21 NOTE — ED Notes (Signed)
covid swab obtained.

## 2020-09-21 NOTE — ED Provider Notes (Signed)
[facility]    CSN: [id number] Arrival date & time: 09/21/20  2595      History   Chief Complaint Chief Complaint  Patient presents with  . Vomiting    HPI Datra Clary is a 29 y.o. female.   Larra Crunkleton presents with complaints of nausea and vomiting. Low abdominal pain. Vaginal discharge. Symptoms started two days ago. Currently feels nausea. Last emesis this morning. Yellow emesis. No diarrhea. One episode today, yesterday two episodes of vomiting. Has been eating still. Taking liquids. Normal urination. Although has noted some urinary frequency. LMP 10/1, not on birth control. White milky vaginal discharge. History of similar discharge in the past associated with gonorrhea. Doesn't use condoms, has 1 partner. No fevers. No other URI symptoms. No headache. She is concerned about std's today.    ROS per HPI, negative if not otherwise mentioned.      Past Medical History:  Diagnosis Date  . Asthma     Patient Active Problem List   Diagnosis Date Noted  . SVD (spontaneous vaginal delivery) 11/26/2015  . Obstetric vaginal laceration, delivered, current hospitalization 11/26/2015  . Obstetric labial laceration, delivered, current hospitalization 11/26/2015  . Active labor at term 11/26/2015  . Normal labor 11/25/2015    Past Surgical History:  Procedure Laterality Date  . WISDOM TOOTH EXTRACTION Bilateral 2015    OB History    Gravida  2   Para  1   Term  1   Preterm      AB  1   Living  1     SAB  1   TAB      Ectopic      Multiple  0   Live Births  1            Home Medications    Prior to Admission medications   Medication Sig Start Date End Date Taking? Authorizing Provider  Multiple Vitamin (MULTIVITAMIN) capsule Take 1 capsule by mouth daily.   Yes [provider]  Prenatal Vit-Fe Fumarate-FA (PRENATAL VITAMINS) 28-0.8 MG TABS Take 1 tablet by mouth daily. 03/30/15  Yes Rasch, Harolyn Rutherford, NP  albuterol  (PROVENTIL HFA;VENTOLIN HFA) 108 (90 BASE) MCG/ACT inhaler Inhale 1-2 puffs into the lungs every 6 (six) hours as needed for wheezing or shortness of breath.    [provider]  doxycycline (VIBRAMYCIN) 100 MG capsule Take 1 capsule (100 mg total) by mouth 2 (two) times daily for 7 days. 09/21/20 09/28/20  Georgetta Haber, NP  ferrous sulfate 325 (65 FE) MG EC tablet Take 1 tablet (325 mg total) by mouth 2 (two) times daily. 11/27/15 11/26/16  Standard, Venus, CNM  ibuprofen (ADVIL,MOTRIN) 600 MG tablet Take 1 tablet (600 mg total) by mouth every 6 (six) hours. 11/27/15   Standard, Venus, CNM  naproxen (NAPROSYN) 500 MG tablet Take 1 tablet (500 mg total) by mouth 2 (two) times daily. 08/20/20   Wieters, Hallie C, PA-C  ondansetron (ZOFRAN-ODT) 4 MG disintegrating tablet Take 1 tablet (4 mg total) by mouth every 8 (eight) hours as needed for nausea or vomiting. 09/21/20   Georgetta Haber, NP    Family History Family History  Problem Relation Age of Onset  . Heart disease Mother   . Diabetes Maternal Grandmother     Social History Social History   Tobacco Use  . Smoking status: Never Smoker  . Smokeless tobacco: Never Used  Vaping Use  . Vaping Use: Never used  Substance Use Topics  .  Alcohol use: Yes    Comment: occasionally  . Drug use: No     Allergies   Peanuts [peanut oil] and Shellfish allergy   Review of Systems Review of Systems   Physical Exam Triage Vital Signs ED Triage Vitals  Enc Vitals Group     BP 09/21/20 0908 101/67     Pulse Rate 09/21/20 0908 84     Resp 09/21/20 0908 18     Temp 09/21/20 0908 97.8 F (36.6 C)     Temp Source 09/21/20 0908 Oral     SpO2 09/21/20 0908 98 %     Weight --      Height --      Head Circumference --      Peak Flow --      Pain Score 09/21/20 0903 5     Pain Loc --      Pain Edu? --      Excl. in GC? --    No data found.  Updated Vital Signs BP 101/67 (BP Location: Right Arm)   Pulse 84   Temp 97.8  F (36.6 C) (Oral)   Resp 18   LMP 09/02/2020   SpO2 98%   Visual Acuity Right Eye Distance:   Left Eye Distance:   Bilateral Distance:    Right Eye Near:   Left Eye Near:    Bilateral Near:     Physical Exam Constitutional:      General: She is not in acute distress.    Appearance: She is well-developed.  Cardiovascular:     Rate and Rhythm: Normal rate.  Pulmonary:     Effort: Pulmonary effort is normal.  Abdominal:     Tenderness: There is abdominal tenderness in the right lower quadrant, suprapubic area and left lower quadrant. There is no right CVA tenderness, left CVA tenderness, guarding or rebound.     Comments: Very mild pelvic pain on palpation  Genitourinary:    Comments: Endorses white vaginal discharge, no bleeding. Vaginal self swab collected today  Skin:    General: Skin is warm and dry.  Neurological:     Mental Status: She is alert and oriented to person, place, and time.      UC Treatments / Results  Labs (all labs ordered are listed, but only abnormal results are displayed) Labs Reviewed  SARS CORONAVIRUS 2 (TAT 6-24 HRS)  POCT URINALYSIS DIPSTICK, ED / UC  POC URINE PREG, ED  CERVICOVAGINAL ANCILLARY ONLY    EKG   Radiology No results found.  Procedures Procedures (including critical care time)  Medications Ordered in UC Medications  cefTRIAXone (ROCEPHIN) injection 500 mg (has no administration in time range)  ondansetron (ZOFRAN-ODT) disintegrating tablet 4 mg (4 mg Oral Given 09/21/20 0954)    Initial Impression / Assessment and Plan / UC Course  I have reviewed the triage vital signs and the nursing notes.  Pertinent labs & imaging results that were available during my care of the patient were reviewed by me and considered in my medical decision making (see chart for details).     Patient is concerned about stds with mild abdominal pain, nausea, vaginal discharge. Empiric treatment initiated. Afebrile. No tachycardia. Vaginal  cytology pending. Return precautions provided. Safe sex encouraged.  Patient verbalized understanding and agreeable to plan.  Final Clinical Impressions(s) / UC Diagnoses   Final diagnoses:  Pelvic pain  Vaginal discharge  Concern about STD in female without diagnosis     Discharge Instructions  Your urine is normal today, negative for pregnancy as well.  We are starting treatment for concern for std's, with testing in process.  We will notify of you any positive findings or if any changes to treatment are needed. If normal or otherwise without concern to your results, we will not call you. Please log on to your MyChart to review your results if interested in so.   Please withhold from intercourse for the next week. Please use condoms to prevent STD's.   Complete course of antibiotics.  If symptoms worsen or do not improve in the next week to return to be seen or to follow up with your PCP.      ED Prescriptions    Medication Sig Dispense Auth. Provider   doxycycline (VIBRAMYCIN) 100 MG capsule Take 1 capsule (100 mg total) by mouth 2 (two) times daily for 7 days. 14 capsule Abhijot Straughter B, NP   ondansetron (ZOFRAN-ODT) 4 MG disintegrating tablet Take 1 tablet (4 mg total) by mouth every 8 (eight) hours as needed for nausea or vomiting. 12 tablet Georgetta Haber, NP     PDMP not reviewed this encounter.   Georgetta Haber, NP 09/21/20 1026

## 2020-09-21 NOTE — Discharge Instructions (Signed)
Your urine is normal today, negative for pregnancy as well.  We are starting treatment for concern for std's, with testing in process.  We will notify of you any positive findings or if any changes to treatment are needed. If normal or otherwise without concern to your results, we will not call you. Please log on to your MyChart to review your results if interested in so.   Please withhold from intercourse for the next week. Please use condoms to prevent STD's.   Complete course of antibiotics.  If symptoms worsen or do not improve in the next week to return to be seen or to follow up with your PCP.

## 2020-09-21 NOTE — ED Notes (Signed)
Sent for urine specimen

## 2020-09-22 LAB — CERVICOVAGINAL ANCILLARY ONLY
Bacterial Vaginitis (gardnerella): POSITIVE — AB
Candida Glabrata: NEGATIVE
Candida Vaginitis: NEGATIVE
Chlamydia: NEGATIVE
Comment: NEGATIVE
Comment: NEGATIVE
Comment: NEGATIVE
Comment: NEGATIVE
Comment: NEGATIVE
Comment: NORMAL
Neisseria Gonorrhea: NEGATIVE
Trichomonas: NEGATIVE

## 2020-09-26 ENCOUNTER — Telehealth (HOSPITAL_COMMUNITY): Payer: Self-pay | Admitting: Emergency Medicine

## 2020-09-26 MED ORDER — METRONIDAZOLE 500 MG PO TABS: 500.0000 mg | ORAL_TABLET | Freq: Two times a day (BID) | ORAL | 0 refills | Status: DC

## 2020-11-08 IMAGING — US US PELVIS COMPLETE WITH TRANSVAGINAL
1 series · 14 of 25 positions shown · non-contrast
Comparison: March 30, 2015.

CLINICAL DATA: Irregular menstruation.

EXAM:
TRANSABDOMINAL AND TRANSVAGINAL ULTRASOUND OF PELVIS
TECHNIQUE: Both transabdominal and transvaginal ultrasound examinations of the
pelvis were performed. Transabdominal technique was performed for
global imaging of the pelvis including uterus, ovaries, adnexal
regions, and pelvic cul-de-sac. It was necessary to proceed with
endovaginal exam following the transabdominal exam to visualize the
adnexal regions.

[Series 1: us pelvis complete with transvaginal · 0.20mm/px · 14 of 92 slices shown]
[im 1/92]
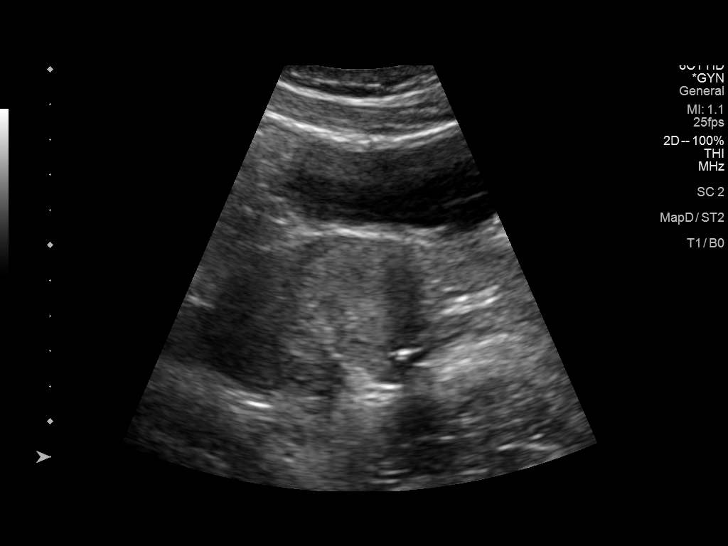
[im 8/92]
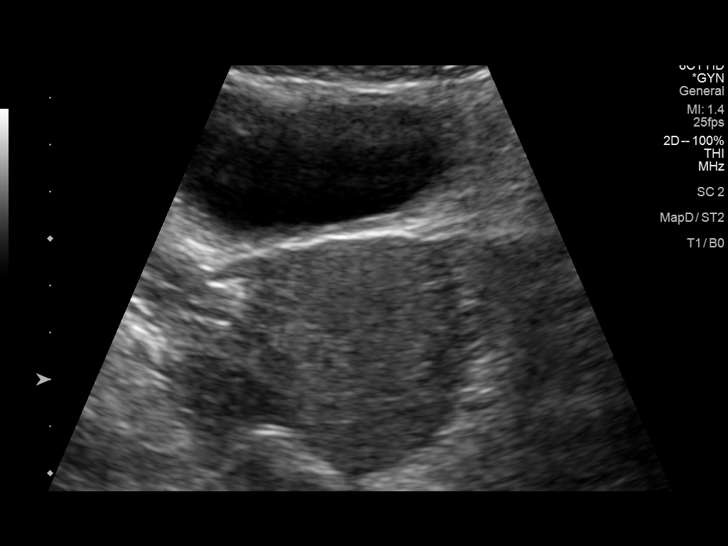
[im 16/92]
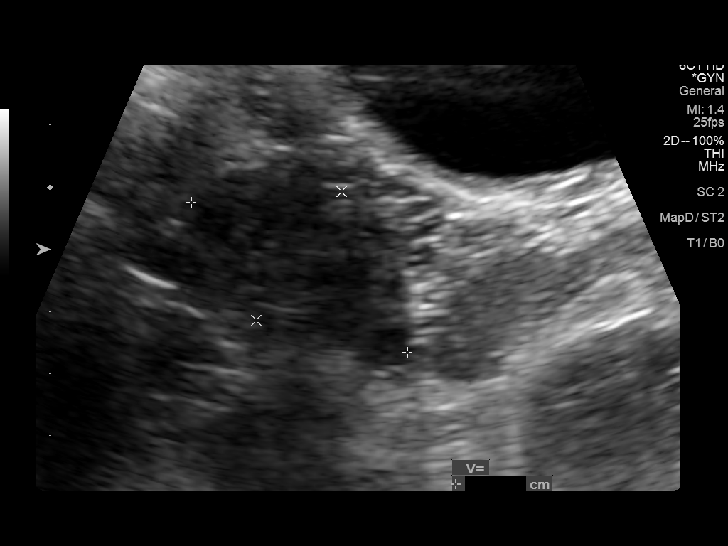
[im 23/92]
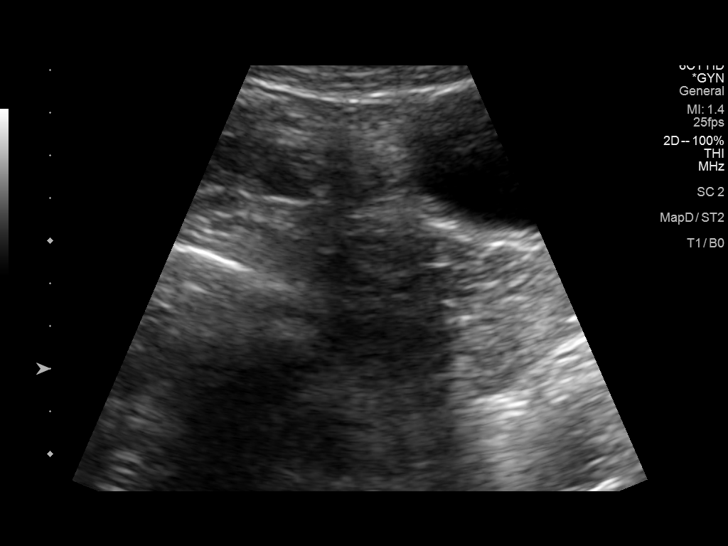
[im 31/92]
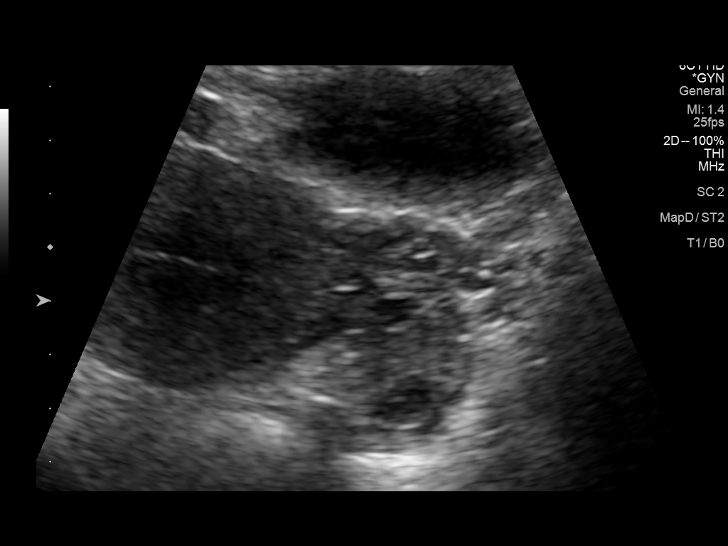
[im 35/92]
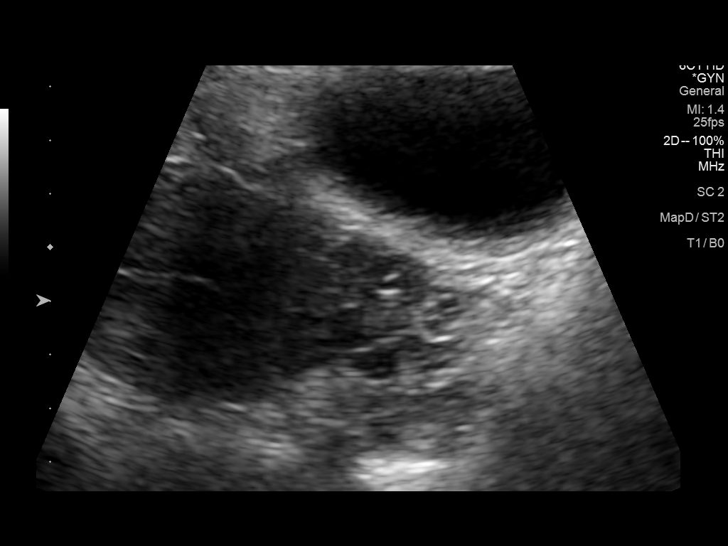
[im 42/92]
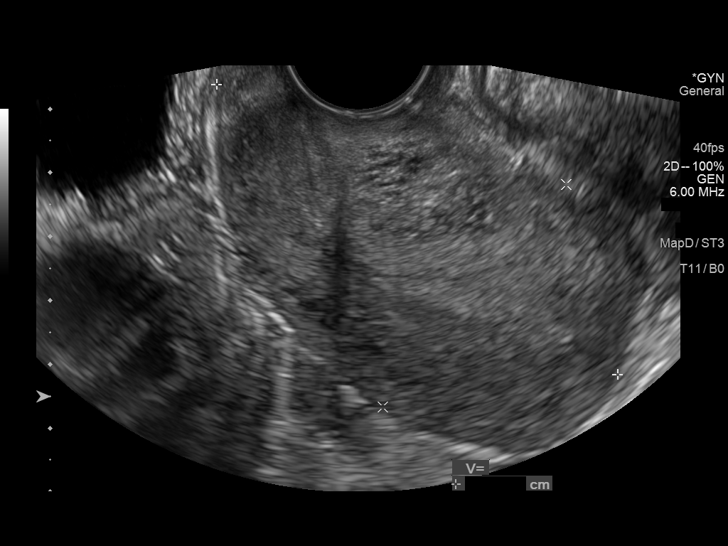
[im 50/92]
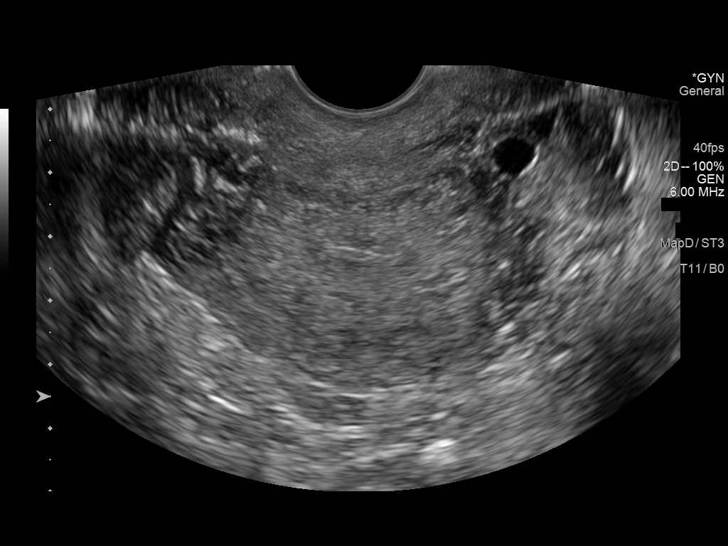
[im 57/92]
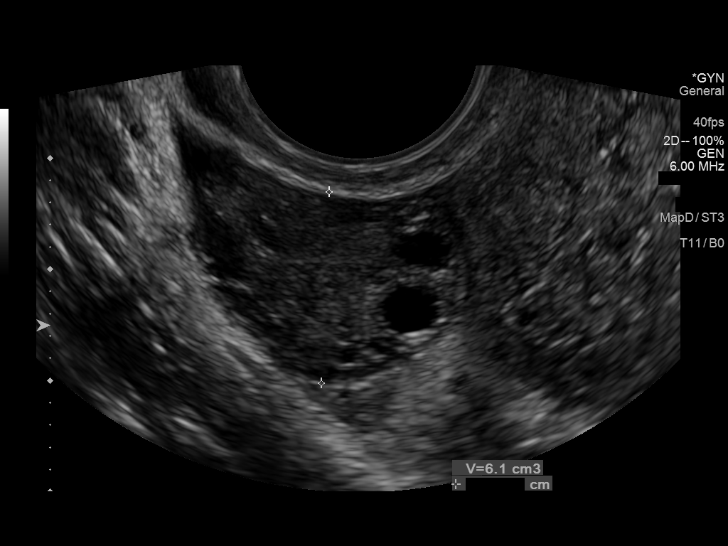
[im 61/92]
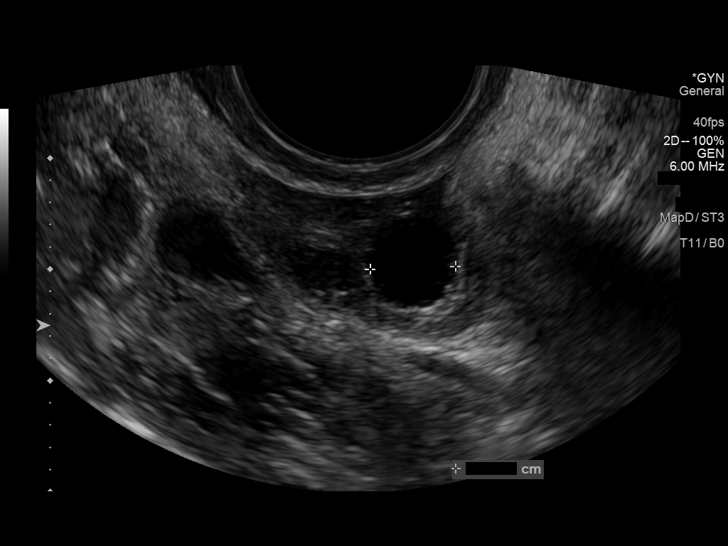
[im 69/92]
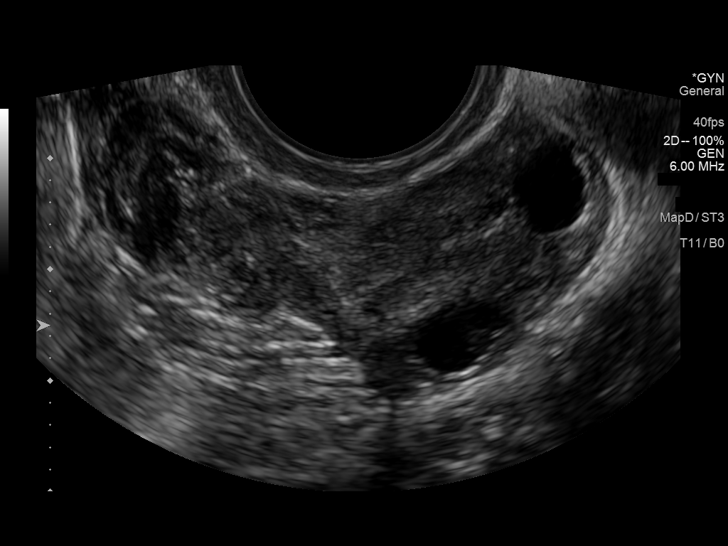
[im 76/92]
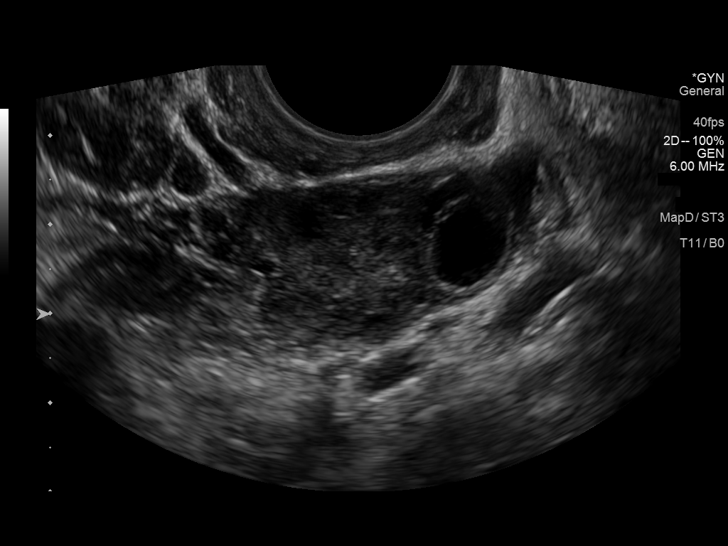
[im 84/92]
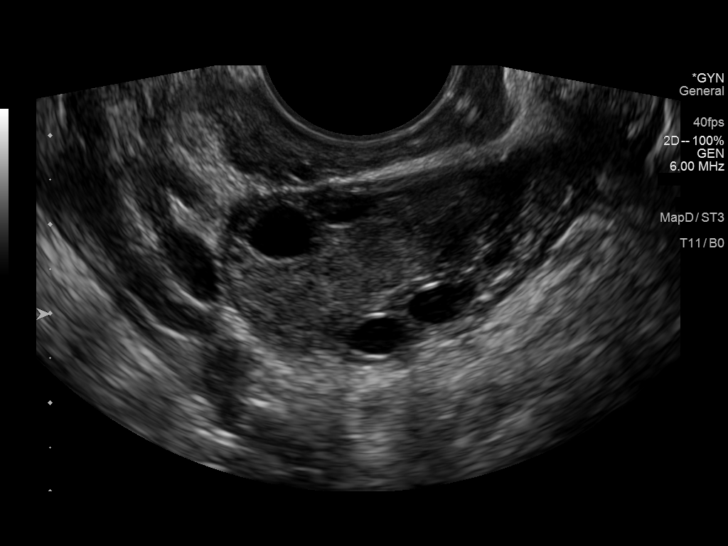
[im 92/92]
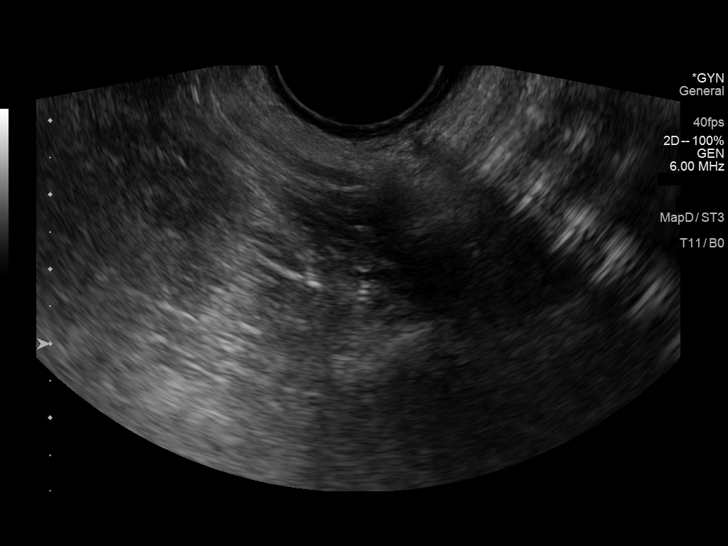

[14 of 25 positions shown; findings below may reference images not displayed]

FINDINGS: Uterus

Measurements: 7.8 x 5.3 x 4.5 cm = volume: 97 mL. No fibroids or
other mass visualized.

Endometrium

Thickness: 4 mm which is within normal limits. No focal abnormality
visualized.

Right ovary

Measurements: 3.7 x 1.8 x 1.7 cm = volume: 6 mL. Normal
appearance/no adnexal mass.

Left ovary

Measurements: 4.4 x 2.3 x 2.0 cm = volume: 10 mL. Normal
appearance/no adnexal mass.

Other findings

No abnormal free fluid.
IMPRESSION: No definite abnormality seen in the pelvis.

## 2021-04-25 ENCOUNTER — Ambulatory Visit (HOSPITAL_COMMUNITY)
Admission: EM | Admit: 2021-04-25 | Discharge: 2021-04-25 | Disposition: A | Discharge status: Home / Self Care | Payer: Medicaid Other | Attending: Emergency Medicine | Admitting: Emergency Medicine

## 2021-04-25 ENCOUNTER — Encounter (HOSPITAL_COMMUNITY): Payer: Self-pay

## 2021-04-25 DIAGNOSIS — Z20822 Contact with and (suspected) exposure to covid-19: Secondary | ICD-10-CM

## 2021-04-25 DIAGNOSIS — N898 Other specified noninflammatory disorders of vagina: Secondary | ICD-10-CM | POA: Diagnosis not present

## 2021-04-25 DIAGNOSIS — Z1152 Encounter for screening for COVID-19: Secondary | ICD-10-CM | POA: Insufficient documentation

## 2021-04-25 DIAGNOSIS — Z113 Encounter for screening for infections with a predominantly sexual mode of transmission: Secondary | ICD-10-CM | POA: Insufficient documentation

## 2021-04-25 LAB — SARS CORONAVIRUS 2 (TAT 6-24 HRS): SARS Coronavirus 2: NEGATIVE

## 2021-04-25 NOTE — ED Provider Notes (Signed)
[facility]    CSN: [id number] Arrival date & time: 04/25/21  0806      History   Chief Complaint Chief Complaint  Patient presents with  . Covid Testing (No Sxs)  . Vaginal Discharge    HPI Taylor Strickland is a 30 y.o. female.   Patient here requesting COVID test and STD screening.  Patient reports that she was exposed to COVID but denies any symptoms.  Denies any STI symptoms or known contact with STI positive patients.  Reports having more discharge than normal intermittently, but denies any pain or odor.  Denies any dysuria, urgency or frequency.  Denies any trauma, injury, or other precipitating event.  Denies any specific alleviating or aggravating factors.  Denies any fevers, chest pain, shortness of breath, N/V/D, numbness, tingling, weakness, abdominal pain, or headaches.     The history is provided by the patient.  Vaginal Discharge   Past Medical History:  Diagnosis Date  . Asthma     Patient Active Problem List   Diagnosis Date Noted  . SVD (spontaneous vaginal delivery) 11/26/2015  . Obstetric vaginal laceration, delivered, current hospitalization 11/26/2015  . Obstetric labial laceration, delivered, current hospitalization 11/26/2015  . Active labor at term 11/26/2015  . Normal labor 11/25/2015    Past Surgical History:  Procedure Laterality Date  . WISDOM TOOTH EXTRACTION Bilateral 2015    OB History    Gravida  2   Para  1   Term  1   Preterm      AB  1   Living  1     SAB  1   IAB      Ectopic      Multiple  0   Live Births  1            Home Medications    Prior to Admission medications   Medication Sig Start Date End Date Taking? Authorizing Provider  albuterol (PROVENTIL HFA;VENTOLIN HFA) 108 (90 BASE) MCG/ACT inhaler Inhale 1-2 puffs into the lungs every 6 (six) hours as needed for wheezing or shortness of breath.    [provider]  ferrous sulfate 325 (65 FE) MG EC tablet Take 1 tablet (325 mg  total) by mouth 2 (two) times daily. 11/27/15 11/26/16  Standard, Venus, CNM  ibuprofen (ADVIL,MOTRIN) 600 MG tablet Take 1 tablet (600 mg total) by mouth every 6 (six) hours. 11/27/15   Standard, Venus, CNM  metroNIDAZOLE (FLAGYL) 500 MG tablet Take 1 tablet (500 mg total) by mouth 2 (two) times daily. 09/26/20   Merrilee Jansky, MD  Multiple Vitamin (MULTIVITAMIN) capsule Take 1 capsule by mouth daily.    [provider]  naproxen (NAPROSYN) 500 MG tablet Take 1 tablet (500 mg total) by mouth 2 (two) times daily. 08/20/20   Wieters, Hallie C, PA-C  ondansetron (ZOFRAN-ODT) 4 MG disintegrating tablet Take 1 tablet (4 mg total) by mouth every 8 (eight) hours as needed for nausea or vomiting. 09/21/20   Georgetta Haber, NP  Prenatal Vit-Fe Fumarate-FA (PRENATAL VITAMINS) 28-0.8 MG TABS Take 1 tablet by mouth daily. 03/30/15   Rasch, Harolyn Rutherford, NP    Family History Family History  Problem Relation Age of Onset  . Heart disease Mother   . Diabetes Maternal Grandmother     Social History Social History   Tobacco Use  . Smoking status: Never Smoker  . Smokeless tobacco: Never Used  Vaping Use  . Vaping Use: Never used  Substance Use Topics  .  Alcohol use: Yes    Comment: occasionally  . Drug use: No     Allergies   Peanuts [peanut oil] and Shellfish allergy   Review of Systems Review of Systems  Genitourinary: Positive for vaginal discharge.  All other systems reviewed and are negative.    Physical Exam Triage Vital Signs ED Triage Vitals  Enc Vitals Group     BP 04/25/21 0830 113/77     Pulse Rate 04/25/21 0830 85     Resp 04/25/21 0830 18     Temp 04/25/21 0830 99.9 F (37.7 C)     Temp Source 04/25/21 0830 Oral     SpO2 04/25/21 0830 98 %     Weight --      Height --      Head Circumference --      Peak Flow --      Pain Score 04/25/21 0827 0     Pain Loc --      Pain Edu? --      Excl. in GC? --    No data found.  Updated Vital Signs BP  113/77 (BP Location: Left Arm)   Pulse 85   Temp 99.9 F (37.7 C) (Oral)   Resp 18   LMP 03/30/2021   SpO2 98%   Visual Acuity Right Eye Distance:   Left Eye Distance:   Bilateral Distance:    Right Eye Near:   Left Eye Near:    Bilateral Near:     Physical Exam Vitals and nursing note reviewed.  Constitutional:      General: She is not in acute distress.    Appearance: Normal appearance. She is not ill-appearing, toxic-appearing or diaphoretic.  HENT:     Head: Normocephalic and atraumatic.  Eyes:     Conjunctiva/sclera: Conjunctivae normal.  Cardiovascular:     Rate and Rhythm: Normal rate.     Pulses: Normal pulses.  Pulmonary:     Effort: Pulmonary effort is normal.  Abdominal:     General: Abdomen is flat.  Genitourinary:    Comments: declines Musculoskeletal:        General: Normal range of motion.     Cervical back: Normal range of motion.  Skin:    General: Skin is warm and dry.  Neurological:     General: No focal deficit present.     Mental Status: She is alert and oriented to person, place, and time.  Psychiatric:        Mood and Affect: Mood normal.      UC Treatments / Results  Labs (all labs ordered are listed, but only abnormal results are displayed) Labs Reviewed  SARS CORONAVIRUS 2 (TAT 6-24 HRS)  CERVICOVAGINAL ANCILLARY ONLY    EKG   Radiology No results found.  Procedures Procedures (including critical care time)  Medications Ordered in UC Medications - No data to display  Initial Impression / Assessment and Plan / UC Course  I have reviewed the triage vital signs and the nursing notes.  Pertinent labs & imaging results that were available during my care of the patient were reviewed by me and considered in my medical decision making (see chart for details).    Assessment negative for red flags or concerns.  COVID test pending. Self swab obtained and will treat based on results.  Declined RPR or HIV screen at this time.   Discussed safe sex practices including condom or other barrier method use.  Follow up with primary care as needed.  Final Clinical Impressions(s) / UC Diagnoses   Final diagnoses:  Encounter for screening for COVID-19  Screen for STD (sexually transmitted disease)     Discharge Instructions     We will contact you with the results from your lab work and any additional treatment.    Do not have sex while taking undergoing treatment for STI.  Make sure that all of your partners get tested and treated.   Use a condom or other barrier method for all sexual encounters.    Return or go to the Emergency Department if symptoms worsen or do not improve in the next few days.      ED Prescriptions    None     PDMP not reviewed this encounter.   Ivette Loyal, NP 04/25/21 1006

## 2021-04-25 NOTE — Discharge Instructions (Addendum)
We will contact you with the results from your lab work and any additional treatment.    Do not have sex while taking undergoing treatment for STI.  Make sure that all of your partners get tested and treated.   Use a condom or other barrier method for all sexual encounters.    Return or go to the Emergency Department if symptoms worsen or do not improve in the next few days.   

## 2021-04-25 NOTE — ED Triage Notes (Signed)
Pt presents for covid testing with no known symptoms.  Pt also complains of vaginal discharge X 2 days.

## 2021-04-26 LAB — CERVICOVAGINAL ANCILLARY ONLY
Bacterial Vaginitis (gardnerella): POSITIVE — AB
Candida Glabrata: NEGATIVE
Candida Vaginitis: POSITIVE — AB
Chlamydia: NEGATIVE
Comment: NEGATIVE
Comment: NEGATIVE
Comment: NEGATIVE
Comment: NEGATIVE
Comment: NEGATIVE
Comment: NORMAL
Neisseria Gonorrhea: NEGATIVE
Trichomonas: NEGATIVE

## 2021-04-27 ENCOUNTER — Telehealth (HOSPITAL_COMMUNITY): Payer: Self-pay | Admitting: Emergency Medicine

## 2021-04-27 MED ORDER — METRONIDAZOLE 500 MG PO TABS: 500.0000 mg | ORAL_TABLET | Freq: Two times a day (BID) | ORAL | 0 refills | Status: DC

## 2021-04-27 MED ORDER — FLUCONAZOLE 150 MG PO TABS: 150.0000 mg | ORAL_TABLET | Freq: Once | ORAL | 0 refills | Status: AC

## 2022-06-29 ENCOUNTER — Ambulatory Visit (HOSPITAL_COMMUNITY)
Admission: EM | Admit: 2022-06-29 | Discharge: 2022-06-29 | Disposition: A | Discharge status: Home / Self Care | Payer: Medicaid Other | Attending: Internal Medicine | Admitting: Internal Medicine

## 2022-06-29 ENCOUNTER — Encounter (HOSPITAL_COMMUNITY): Payer: Self-pay

## 2022-06-29 ENCOUNTER — Telehealth (HOSPITAL_COMMUNITY): Payer: Self-pay | Admitting: Internal Medicine

## 2022-06-29 DIAGNOSIS — Z113 Encounter for screening for infections with a predominantly sexual mode of transmission: Secondary | ICD-10-CM

## 2022-06-29 DIAGNOSIS — J029 Acute pharyngitis, unspecified: Secondary | ICD-10-CM | POA: Diagnosis not present

## 2022-06-29 DIAGNOSIS — N898 Other specified noninflammatory disorders of vagina: Secondary | ICD-10-CM | POA: Insufficient documentation

## 2022-06-29 DIAGNOSIS — Z20822 Contact with and (suspected) exposure to covid-19: Secondary | ICD-10-CM | POA: Diagnosis not present

## 2022-06-29 DIAGNOSIS — R059 Cough, unspecified: Secondary | ICD-10-CM | POA: Insufficient documentation

## 2022-06-29 LAB — SARS CORONAVIRUS 2 (TAT 6-24 HRS): SARS Coronavirus 2: NEGATIVE

## 2022-06-29 LAB — POCT RAPID STREP A, ED / UC: Streptococcus, Group A Screen (Direct): NEGATIVE

## 2022-06-29 NOTE — Telephone Encounter (Signed)
A user error has taken place: encounter opened in error, closed for administrative reasons.

## 2022-06-29 NOTE — ED Triage Notes (Addendum)
Patient states having cough and sore throat for a few days. 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 No runny nose, body aches, or fever.   Vaginal discharge with a yellow coloration for a day. No foul smell, slight itching. No urinary symptoms, low back pain or low abdominal pain. Patient would like to be tested for chlamydia and gonorrhea along with the swab for BV/Trich/Yeast.

## 2022-06-29 NOTE — ED Provider Notes (Signed)
[facility]    CSN: [id number] Arrival date & time: 06/29/22  0940      History   Chief Complaint Chief Complaint  Patient presents with   Cough   Sore Throat   Vaginal Discharge    HPI Taylor Strickland is a 31 y.o. female.   Patient presents with sore throat that has been present for approximately 2 days.  Denies any other associated upper respiratory symptoms, cough, fever, chest pain, shortness of breath, nausea, vomiting, diarrhea, abdominal pain.  She has not taken any medications to help alleviate symptoms.  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  She also endorses some yellow to clear vaginal discharge that started yesterday.  Denies any associated urinary symptoms, pelvic pain, abdominal pain, fever, back pain, hematuria, abnormal vaginal bleeding.  Denies any known exposure to STD.   Cough Sore Throat  Vaginal Discharge   Past Medical History:  Diagnosis Date   Asthma     Patient Active Problem List   Diagnosis Date Noted   SVD (spontaneous vaginal delivery) 11/26/2015   Obstetric vaginal laceration, delivered, current hospitalization 11/26/2015   Obstetric labial laceration, delivered, current hospitalization 11/26/2015   Active labor at term 11/26/2015   Normal labor 11/25/2015    Past Surgical History:  Procedure Laterality Date   WISDOM TOOTH EXTRACTION Bilateral 2015    OB History     Gravida  2   Para  1   Term  1   Preterm      AB  1   Living  1      SAB  1   IAB      Ectopic      Multiple  0   Live Births  1            Home Medications    Prior to Admission medications   Medication Sig Start Date End Date Taking? Authorizing Provider  Multiple Vitamin (MULTIVITAMIN) capsule Take 1 capsule by mouth daily.   Yes [provider]  albuterol (PROVENTIL HFA;VENTOLIN HFA) 108 (90 BASE) MCG/ACT inhaler Inhale 1-2 puffs into the lungs  every 6 (six) hours as needed for wheezing or shortness of breath.    [provider]  ferrous sulfate 325 (65 FE) MG EC tablet Take 1 tablet (325 mg total) by mouth 2 (two) times daily. 11/27/15 11/26/16  Standard, Venus, CNM  ibuprofen (ADVIL,MOTRIN) 600 MG tablet Take 1 tablet (600 mg total) by mouth every 6 (six) hours. 11/27/15   Standard, Venus, CNM  metroNIDAZOLE (FLAGYL) 500 MG tablet Take 1 tablet (500 mg total) by mouth 2 (two) times daily. 04/27/21   Lamptey, Britta Mccreedy, MD  naproxen (NAPROSYN) 500 MG tablet Take 1 tablet (500 mg total) by mouth 2 (two) times daily. 08/20/20   Wieters, Hallie C, PA-C  ondansetron (ZOFRAN-ODT) 4 MG disintegrating tablet Take 1 tablet (4 mg total) by mouth every 8 (eight) hours as needed for nausea or vomiting. 09/21/20   Georgetta Haber, NP  Prenatal Vit-Fe Fumarate-FA (PRENATAL VITAMINS) 28-0.8 MG TABS Take 1 tablet by mouth daily. 03/30/15   Rasch, Harolyn Rutherford, NP    Family History Family History  Problem Relation Age of Onset   Heart disease Mother    Diabetes Maternal Grandmother     Social History Social History   Tobacco Use   Smoking status: Never   Smokeless tobacco: Never  Vaping Use   Vaping  Use: Never used  Substance Use Topics   Alcohol use: Yes    Comment: occasionally   Drug use: No     Allergies   Peanuts [peanut oil] and Shellfish allergy   Review of Systems Review of Systems Per HPI  Physical Exam Triage Vital Signs ED Triage Vitals  Enc Vitals Group     BP 06/29/22 0823 114/73     Pulse Rate 06/29/22 0823 72     Resp 06/29/22 0823 16     Temp 06/29/22 0823 (!) 97.4 F (36.3 C)     Temp Source 06/29/22 0823 Oral     SpO2 06/29/22 0823 98 %     Weight 06/29/22 0825 155 lb (70.3 kg)     Height 06/29/22 0825 5\' 3"  (1.6 m)     Head Circumference --      Peak Flow --      Pain Score 06/29/22 0825 4     Pain Loc --      Pain Edu? --      Excl. in GC? --    No data found.  Updated Vital Signs BP  114/73 (BP Location: Left Arm)   Pulse 72   Temp (!) 97.4 F (36.3 C) (Oral)   Resp 16   Ht 5\' 3"  (1.6 m)   Wt 155 lb (70.3 kg)   LMP 06/20/2022 (Exact Date)   SpO2 98%   BMI 27.46 kg/m   Visual Acuity Right Eye Distance:   Left Eye Distance:   Bilateral Distance:    Right Eye Near:   Left Eye Near:    Bilateral Near:     Physical Exam Constitutional:      General: She is not in acute distress.    Appearance: Normal appearance. She is not toxic-appearing or diaphoretic.  HENT:     Head: Normocephalic and atraumatic.     Right Ear: Tympanic membrane and ear canal normal.     Left Ear: Tympanic membrane and ear canal normal.     Nose: Congestion present.     Mouth/Throat:     Mouth: Mucous membranes are moist.     Pharynx: Posterior oropharyngeal erythema present. No pharyngeal swelling, oropharyngeal exudate or uvula swelling.     Tonsils: No tonsillar exudate or tonsillar abscesses.  Eyes:     Extraocular Movements: Extraocular movements intact.     Conjunctiva/sclera: Conjunctivae normal.     Pupils: Pupils are equal, round, and reactive to light.  Cardiovascular:     Rate and Rhythm: Normal rate and regular rhythm.     Pulses: Normal pulses.     Heart sounds: Normal heart sounds.  Pulmonary:     Effort: Pulmonary effort is normal. No respiratory distress.     Breath sounds: Normal breath sounds. No stridor. No wheezing, rhonchi or rales.  Abdominal:     General: Abdomen is flat. Bowel sounds are normal.     Palpations: Abdomen is soft.  Genitourinary:    Comments: Deferred with shared decision making. Self swab performed.  Musculoskeletal:        General: Normal range of motion.     Cervical back: Normal range of motion.  Skin:    General: Skin is warm and dry.  Neurological:     General: No focal deficit present.     Mental Status: She is alert and oriented to person, place, and time. Mental status is at baseline.  Psychiatric:        Mood and Affect:  Mood normal.        Behavior: Behavior normal.        Thought Content: Thought content normal.        Judgment: Judgment normal.      UC Treatments / Results  Labs (all labs ordered are listed, but only abnormal results are displayed) Labs Reviewed  CULTURE, GROUP A STREP ([facility])  SARS CORONAVIRUS 2 (TAT 6-24 HRS)  POCT RAPID STREP A, ED / UC  CERVICOVAGINAL ANCILLARY ONLY    EKG   Radiology No results found.  Procedures Procedures (including critical care time)  Medications Ordered in UC Medications - No data to display  Initial Impression / Assessment and Plan / UC Course  I have reviewed the triage vital signs and the nursing notes.  Pertinent labs & imaging results that were available during my care of the patient were reviewed by me and considered in my medical decision making (see chart for details).     Rapid strep was negative.  Throat culture pending.  Suspect viral cause to patient's symptoms.  COVID test pending.  Discussed supportive care and symptom management to help alleviate symptoms with patient.  No signs of peritonsillar abscess on exam.  Cervicovaginal swab pending to evaluate patient's vaginal discharge.  Will await results for treatment.  Patient to refrain from sexual activity until test results and treatment are complete.  Discussed return precautions.  Patient verbalized understanding and was agreeable with plan. Final Clinical Impressions(s) / UC Diagnoses   Final diagnoses:  Sore throat  Vaginal discharge  Screening examination for venereal disease     Discharge Instructions      Your rapid strep test was negative.  Throat culture and COVID test pending.  Suspect that you have a viral illness as we discussed.  Recommend supportive care and symptom management as we discussed as well.  Your vaginal swab is pending.  We will call if it is positive and treat as appropriate.  Please refrain from sexual activity until test results and treatment  are complete.    ED Prescriptions   None    PDMP not reviewed this encounter.   Gustavus Bryant, [place] 06/29/22 [id number]

## 2022-06-29 NOTE — Discharge Instructions (Signed)
Your rapid strep test was negative.  Throat culture and COVID test pending.  Suspect that you have a viral illness as we discussed.  Recommend supportive care and symptom management as we discussed as well.  Your vaginal swab is pending.  We will call if it is positive and treat as appropriate.  Please refrain from sexual activity until test results and treatment are complete.

## 2022-07-01 LAB — CULTURE, GROUP A STREP (THRC)

## 2022-07-02 LAB — CERVICOVAGINAL ANCILLARY ONLY
Bacterial Vaginitis (gardnerella): POSITIVE — AB
Candida Glabrata: NEGATIVE
Candida Vaginitis: POSITIVE — AB
Chlamydia: NEGATIVE
Comment: NEGATIVE
Comment: NEGATIVE
Comment: NEGATIVE
Comment: NEGATIVE
Comment: NEGATIVE
Comment: NORMAL
Neisseria Gonorrhea: NEGATIVE
Trichomonas: NEGATIVE

## 2022-07-03 ENCOUNTER — Telehealth (HOSPITAL_COMMUNITY): Payer: Self-pay | Admitting: Emergency Medicine

## 2022-07-03 MED ORDER — FLUCONAZOLE 150 MG PO TABS: 150.0000 mg | ORAL_TABLET | Freq: Once | ORAL | 0 refills | Status: AC

## 2022-07-03 MED ORDER — METRONIDAZOLE 500 MG PO TABS: 500.0000 mg | ORAL_TABLET | Freq: Two times a day (BID) | ORAL | 0 refills | Status: DC

## 2023-03-20 ENCOUNTER — Ambulatory Visit (HOSPITAL_COMMUNITY)
Admission: EM | Admit: 2023-03-20 | Discharge: 2023-03-20 | Disposition: A | Discharge status: Home / Self Care | Payer: Medicaid Other | Attending: Emergency Medicine | Admitting: Emergency Medicine

## 2023-03-20 ENCOUNTER — Encounter (HOSPITAL_COMMUNITY): Payer: Self-pay

## 2023-03-20 DIAGNOSIS — J302 Other seasonal allergic rhinitis: Secondary | ICD-10-CM | POA: Insufficient documentation

## 2023-03-20 DIAGNOSIS — J029 Acute pharyngitis, unspecified: Secondary | ICD-10-CM | POA: Insufficient documentation

## 2023-03-20 LAB — POCT RAPID STREP A (OFFICE): Rapid Strep A Screen: NEGATIVE

## 2023-03-20 MED ORDER — CETIRIZINE HCL 10 MG PO TABS: 10.0000 mg | ORAL_TABLET | Freq: Every day | ORAL | 0 refills | Status: AC

## 2023-03-20 MED ORDER — FLUTICASONE PROPIONATE 50 MCG/ACT NA SUSP: 1.0000 | Freq: Every day | NASAL | 2 refills | Status: AC

## 2023-03-20 NOTE — ED Provider Notes (Signed)
[facility]    CSN: [id number] Arrival date & time: 03/20/23  1324      History   Chief Complaint Chief Complaint  Patient presents with   Sore Throat    HPI Taylor Strickland is a 32 y.o. female.   Patient reports nasal congestion and sore throat that started this AM. She normally takes a Benadryl for her symptoms, but this makes her drowsy so she did not.   Coworker tested positive for strep.   Denies fevers, cough, SOB or CP.   The history is provided by the patient and medical records.    Past Medical History:  Diagnosis Date   Asthma     Patient Active Problem List   Diagnosis Date Noted   SVD (spontaneous vaginal delivery) 11/26/2015   Obstetric vaginal laceration, delivered, current hospitalization 11/26/2015   Obstetric labial laceration, delivered, current hospitalization 11/26/2015   Active labor at term 11/26/2015   Normal labor 11/25/2015    Past Surgical History:  Procedure Laterality Date   WISDOM TOOTH EXTRACTION Bilateral 2015    OB History     Gravida  2   Para  1   Term  1   Preterm      AB  1   Living  1      SAB  1   IAB      Ectopic      Multiple  0   Live Births  1            Home Medications    Prior to Admission medications   Medication Sig Start Date End Date Taking? Authorizing Provider  cetirizine (ZYRTEC ALLERGY) 10 MG tablet Take 1 tablet (10 mg total) by mouth daily. 03/20/23 04/19/23 Yes Rinaldo Ratel, [place] N, FNP  fluticasone [facility]) 50 MCG/ACT nasal spray Place 1 spray into both nostrils daily. 03/20/23  Yes Rinaldo Ratel, [place] N, FNP  albuterol (PROVENTIL HFA;VENTOLIN HFA) 108 (90 BASE) MCG/ACT inhaler Inhale 1-2 puffs into the lungs every 6 (six) hours as needed for wheezing or shortness of breath.    [provider]  Multiple Vitamin (MULTIVITAMIN) capsule Take 1 capsule by mouth daily.    [provider]    Family History Family History  Problem Relation Age of Onset    Heart disease Mother    Diabetes Maternal Grandmother     Social History Social History   Tobacco Use   Smoking status: Never   Smokeless tobacco: Never  Vaping Use   Vaping Use: Never used  Substance Use Topics   Alcohol use: Yes    Comment: occasionally   Drug use: No     Allergies   Peanuts [peanut oil] and Shellfish allergy   Review of Systems Review of Systems  Constitutional:  Negative for chills and fever.  HENT:  Positive for congestion, rhinorrhea and sore throat. Negative for ear pain.   Eyes:  Negative for pain and visual disturbance.  Respiratory:  Negative for cough and shortness of breath.   Cardiovascular:  Negative for chest pain and palpitations.  Gastrointestinal:  Negative for abdominal pain and vomiting.  Genitourinary:  Negative for dysuria, hematuria and vaginal discharge.  Musculoskeletal:  Negative for arthralgias and back pain.  Skin:  Negative for color change and rash.  Neurological:  Negative for seizures and syncope.  All other systems reviewed and are negative.    Physical Exam Triage Vital Signs ED Triage Vitals  Enc Vitals Group     BP  Pulse      Resp      Temp      Temp src      SpO2      Weight      Height      Head Circumference      Peak Flow      Pain Score      Pain Loc      Pain Edu?      Excl. in GC?    No data found.  Updated Vital Signs BP 116/82 (BP Location: Left Arm)   Pulse 74   Temp 99 F (37.2 C) (Oral)   Resp 18   LMP 02/20/2023   SpO2 98%   Visual Acuity Right Eye Distance:   Left Eye Distance:   Bilateral Distance:    Right Eye Near:   Left Eye Near:    Bilateral Near:     Physical Exam Vitals and nursing note reviewed.  Constitutional:      General: She is not in acute distress.    Appearance: Normal appearance. She is well-developed.  HENT:     Head: Normocephalic and atraumatic.     Right Ear: External ear normal.     Left Ear: External ear normal.     Nose: Congestion and  rhinorrhea present.     Mouth/Throat:     Mouth: Mucous membranes are moist.     Pharynx: Posterior oropharyngeal erythema present. No oropharyngeal exudate.     Tonsils: No tonsillar exudate or tonsillar abscesses.  Eyes:     General: Lids are normal. No scleral icterus.       Right eye: No discharge.        Left eye: No discharge.     Conjunctiva/sclera: Conjunctivae normal.  Cardiovascular:     Rate and Rhythm: Normal rate and regular rhythm.     Heart sounds: Normal heart sounds, S1 normal and S2 normal. No murmur heard. Pulmonary:     Effort: Pulmonary effort is normal. No respiratory distress.     Breath sounds: Normal breath sounds.     Comments: Lungs vesicular posteriorly. Musculoskeletal:        General: No swelling. Normal range of motion.     Cervical back: Neck supple.  Skin:    General: Skin is warm and dry.     Capillary Refill: Capillary refill takes less than 2 seconds.  Neurological:     Mental Status: She is alert and oriented to person, place, and time.  Psychiatric:        Mood and Affect: Mood normal.        Behavior: Behavior normal. Behavior is cooperative.      UC Treatments / Results  Labs (all labs ordered are listed, but only abnormal results are displayed) Labs Reviewed  CULTURE, GROUP A STREP [facility])  POCT RAPID STREP A (OFFICE)    EKG   Radiology No results found.  Procedures Procedures (including critical care time)  Medications Ordered in UC Medications - No data to display  Initial Impression / Assessment and Plan / UC Course  I have reviewed the triage vital signs and the nursing notes.  Pertinent labs & imaging results that were available during my care of the patient were reviewed by me and considered in my medical decision making (see chart for details).  Vitals and triage reviewed, patient is hemodynamically stable.  Patient with clear postnasal drip, posterior pharynx erythema without exudate, uvula midline, low concern  for peritonsillar abscess  or acute bacterial infection.  Suspect seasonal allergic rhinitis.  Patient with recent strep exposure, rapid strep in clinic negative, will send for culture.  Discussed symptomatic management for pharyngitis and allergic rhinitis.  Patient verbalized understanding, no questions at this time.      Final Clinical Impressions(s) / UC Diagnoses   Final diagnoses:  Pharyngitis, unspecified etiology  Seasonal allergic rhinitis, unspecified trigger     Discharge Instructions      Please take the daily antihistamine Zyrtec.  This will not make you drowsy like the Benadryl.  You can also use the Flonase nasal spray to help with your nasal congestion.  Your rapid strep testing was negative today in clinic, we are sending this off for culture and we will contact if we need to start you on antibiotics.  If your throat culture is negative, this is likely due to allergic symptoms are a viral illness.  For symptomatic relief of your sore throat you can alternate between Tylenol and ibuprofen every 4-6 hours, do warm saline gargles, tea with warm honey and sleep with a humidifier.   Please follow-up with your primary care provider or this clinic for any new or concerning symptoms.       ED Prescriptions     Medication Sig Dispense Auth. Provider   fluticasone (FLONASE) 50 MCG/ACT nasal spray Place 1 spray into both nostrils daily. 9.9 mL Lusine Corlett, [place], FNP   cetirizine (ZYRTEC ALLERGY) 10 MG tablet Take 1 tablet (10 mg total) by mouth daily. 30 tablet Chantrice Hagg, [place] N, [place]      PDMP not reviewed this encounter.   Anginette Espejo, [place] N, [place] 03/20/23 1040

## 2023-03-20 NOTE — Discharge Instructions (Addendum)
Please take the daily antihistamine Zyrtec.  This will not make you drowsy like the Benadryl.  You can also use the Flonase nasal spray to help with your nasal congestion.  Your rapid strep testing was negative today in clinic, we are sending this off for culture and we will contact if we need to start you on antibiotics.  If your throat culture is negative, this is likely due to allergic symptoms are a viral illness.  For symptomatic relief of your sore throat you can alternate between Tylenol and ibuprofen every 4-6 hours, do warm saline gargles, tea with warm honey and sleep with a humidifier.   Please follow-up with your primary care provider or this clinic for any new or concerning symptoms.

## 2023-03-20 NOTE — ED Triage Notes (Signed)
Pt states woke up with a sore throat this morning. States her co-worker was dx'd with strep. Denies taking any meds.

## 2023-03-22 LAB — CULTURE, GROUP A STREP (THRC)

## 2023-09-02 ENCOUNTER — Other Ambulatory Visit: Payer: Self-pay

## 2023-09-02 ENCOUNTER — Ambulatory Visit (HOSPITAL_COMMUNITY)
Admission: EM | Admit: 2023-09-02 | Discharge: 2023-09-02 | Disposition: A | Discharge status: Home / Self Care | Payer: Medicaid Other | Attending: Family Medicine | Admitting: Family Medicine

## 2023-09-02 ENCOUNTER — Encounter (HOSPITAL_COMMUNITY): Payer: Self-pay | Admitting: Emergency Medicine

## 2023-09-02 DIAGNOSIS — Z1152 Encounter for screening for COVID-19: Secondary | ICD-10-CM | POA: Diagnosis not present

## 2023-09-02 DIAGNOSIS — J029 Acute pharyngitis, unspecified: Secondary | ICD-10-CM | POA: Diagnosis not present

## 2023-09-02 LAB — POCT RAPID STREP A (OFFICE): Rapid Strep A Screen: NEGATIVE

## 2023-09-02 LAB — SARS CORONAVIRUS 2 (TAT 6-24 HRS): SARS Coronavirus 2: NEGATIVE

## 2023-09-02 MED ORDER — IBUPROFEN 600 MG PO TABS: 600.0000 mg | ORAL_TABLET | Freq: Three times a day (TID) | ORAL | 0 refills | Status: AC | PRN

## 2023-09-02 NOTE — ED Provider Notes (Signed)
[facility]    CSN: [id number] Arrival date & time: 09/02/23  1884      History   Chief Complaint Chief Complaint  Patient presents with   Sore Throat    HPI Taylor Strickland is a 32 y.o. female.    Sore Throat  Here for sore throat and postnasal drainage.  Symptoms began on September 28.  She is coughed just a little bit.  No fever or chills and no nausea or vomiting or diarrhea.  No shortness of breath.  Last menstrual cycle was September 23.  No known drug allergies  Past Medical History:  Diagnosis Date   Asthma     Patient Active Problem List   Diagnosis Date Noted   SVD (spontaneous vaginal delivery) 11/26/2015   Obstetric vaginal laceration, delivered, current hospitalization 11/26/2015   Obstetric labial laceration, delivered, current hospitalization 11/26/2015   Active labor at term 11/26/2015   Normal labor 11/25/2015    Past Surgical History:  Procedure Laterality Date   WISDOM TOOTH EXTRACTION Bilateral 2015    OB History     Gravida  2   Para  1   Term  1   Preterm      AB  1   Living  1      SAB  1   IAB      Ectopic      Multiple  0   Live Births  1            Home Medications    Prior to Admission medications   Medication Sig Start Date End Date Taking? Authorizing Provider  ibuprofen (ADVIL) 600 MG tablet Take 1 tablet (600 mg total) by mouth every 8 (eight) hours as needed (pain). 09/02/23  Yes Zenia Resides, MD  albuterol (PROVENTIL HFA;VENTOLIN HFA) 108 (90 BASE) MCG/ACT inhaler Inhale 1-2 puffs into the lungs every 6 (six) hours as needed for wheezing or shortness of breath.    [provider]  cetirizine (ZYRTEC ALLERGY) 10 MG tablet Take 1 tablet (10 mg total) by mouth daily. Patient not taking: Reported on 09/02/2023 03/20/23 04/19/23  Garrison, [place] N, FNP  fluticasone [facility]) 50 MCG/ACT nasal spray Place 1 spray into both nostrils daily. Patient not taking: Reported on 09/02/2023  03/20/23   Garrison, [place] N, FNP  Multiple Vitamin (MULTIVITAMIN) capsule Take 1 capsule by mouth daily.    [provider]    Family History Family History  Problem Relation Age of Onset   Heart disease Mother    Diabetes Maternal Grandmother     Social History Social History   Tobacco Use   Smoking status: Never   Smokeless tobacco: Never  Vaping Use   Vaping status: Never Used  Substance Use Topics   Alcohol use: Yes    Comment: occasionally   Drug use: No     Allergies   Peanuts [peanut oil] and Shellfish allergy   Review of Systems Review of Systems   Physical Exam Triage Vital Signs ED Triage Vitals  Encounter Vitals Group     BP 09/02/23 0928 107/73     Systolic BP Percentile --      Diastolic BP Percentile --      Pulse Rate 09/02/23 0928 61     Resp 09/02/23 0928 18     Temp 09/02/23 0928 98.8 F (37.1 C)     Temp Source 09/02/23 0928 Oral     SpO2 09/02/23 0928 98 %  Weight --      Height --      Head Circumference --      Peak Flow --      Pain Score 09/02/23 0926 4     Pain Loc --      Pain Education --      Exclude from Growth Chart --    No data found.  Updated Vital Signs BP 107/73 (BP Location: Right Arm)   Pulse 61   Temp 98.8 F (37.1 C) (Oral)   Resp 18   LMP 08/26/2023   SpO2 98%   Visual Acuity Right Eye Distance:   Left Eye Distance:   Bilateral Distance:    Right Eye Near:   Left Eye Near:    Bilateral Near:     Physical Exam Vitals reviewed.  Constitutional:      General: She is not in acute distress.    Appearance: She is not toxic-appearing.  HENT:     Right Ear: Tympanic membrane and ear canal normal.     Left Ear: Tympanic membrane and ear canal normal.     Nose: Nose normal.     Mouth/Throat:     Mouth: Mucous membranes are moist.     Comments: There is erythema of the posterior oropharynx and the tonsils.  Tonsils are normal size, but there is white and yellow exudate in the crypts.  No  asymmetry and uvula is in the midline. Eyes:     Extraocular Movements: Extraocular movements intact.     Conjunctiva/sclera: Conjunctivae normal.     Pupils: Pupils are equal, round, and reactive to light.  Cardiovascular:     Rate and Rhythm: Normal rate and regular rhythm.     Heart sounds: No murmur heard. Pulmonary:     Effort: Pulmonary effort is normal. No respiratory distress.     Breath sounds: No stridor. No wheezing, rhonchi or rales.  Musculoskeletal:     Cervical back: Neck supple.  Lymphadenopathy:     Cervical: No cervical adenopathy.  Skin:    Capillary Refill: Capillary refill takes less than 2 seconds.     Coloration: Skin is not jaundiced or pale.  Neurological:     General: No focal deficit present.     Mental Status: She is alert and oriented to person, place, and time.  Psychiatric:        Behavior: Behavior normal.      UC Treatments / Results  Labs (all labs ordered are listed, but only abnormal results are displayed) Labs Reviewed  CULTURE, GROUP A STREP ([facility])  SARS CORONAVIRUS 2 (TAT 6-24 HRS)  POCT RAPID STREP A (OFFICE)    EKG   Radiology No results found.  Procedures Procedures (including critical care time)  Medications Ordered in UC Medications - No data to display  Initial Impression / Assessment and Plan / UC Course  I have reviewed the triage vital signs and the nursing notes.  Pertinent labs & imaging results that were available during my care of the patient were reviewed by me and considered in my medical decision making (see chart for details).        Rapid strep is negative, and throat culture is sent.  If positive we will notify her and treat per protocol.  COVID swab was done and if positive she will be notified and now she needs to isolate.  Ibuprofen is sent in for the throat pain.  Final diagnoses:  Acute pharyngitis, unspecified etiology  Discharge Instructions      Your strep test is negative.   Culture of the throat will be sent, and staff will notify you if that is in turn positive.   You have been swabbed for COVID, and the test will result in the next 24 hours. Our staff will call you if positive. If the COVID test is positive, you should quarantine until you are fever free for 24 hours and you are starting to feel better, and then take added precautions for the next 5 days, such as physical distancing/wearing a mask and good hand hygiene/washing.  Take ibuprofen 800 mg--1 tab every 8 hours as needed for pain.       ED Prescriptions     Medication Sig Dispense Auth. Provider   ibuprofen (ADVIL) 600 MG tablet Take 1 tablet (600 mg total) by mouth every 8 (eight) hours as needed (pain). 15 tablet Jovonte Commins, Janace Aris, MD      PDMP not reviewed this encounter.   Zenia Resides, MD 09/02/23 1002

## 2023-09-02 NOTE — ED Triage Notes (Signed)
Sore throat for 2 days.  Complains of fatigue, stuffy nose, drainage in throat.  Has managed to cough up some phlegm and blood streaked  Has not had any medications.  Reports she has been drinking tea

## 2023-09-02 NOTE — Discharge Instructions (Addendum)
Your strep test is negative.  Culture of the throat will be sent, and staff will notify you if that is in turn positive.    You have been swabbed for COVID, and the test will result in the next 24 hours. Our staff will call you if positive. If the COVID test is positive, you should quarantine until you are fever free for 24 hours and you are starting to feel better, and then take added precautions for the next 5 days, such as physical distancing/wearing a mask and good hand hygiene/washing.   Take ibuprofen 800 mg--1 tab every 8 hours as needed for pain.   

## 2023-09-04 LAB — CULTURE, GROUP A STREP (THRC)
